# Patient Record
Sex: Male | Born: 1965 | Race: White | Hispanic: No | Smoking: Current some day smoker
Health system: Southern US, Community
[De-identification: ages and names within clinical notes are randomized; demographics above are authoritative.]

## PROBLEM LIST (undated history)

## (undated) DIAGNOSIS — I639 Cerebral infarction, unspecified: Secondary | ICD-10-CM

## (undated) DIAGNOSIS — K760 Fatty (change of) liver, not elsewhere classified: Secondary | ICD-10-CM

## (undated) DIAGNOSIS — I2699 Other pulmonary embolism without acute cor pulmonale: Secondary | ICD-10-CM

## (undated) DIAGNOSIS — K5010 Crohn's disease of large intestine without complications: Secondary | ICD-10-CM

## (undated) DIAGNOSIS — I10 Essential (primary) hypertension: Secondary | ICD-10-CM

## (undated) DIAGNOSIS — D126 Benign neoplasm of colon, unspecified: Secondary | ICD-10-CM

## (undated) HISTORY — PX: REPLACEMENT TOTAL KNEE: SUR1224

## (undated) HISTORY — DX: Crohn's disease of large intestine without complications: K50.10

## (undated) HISTORY — DX: Benign neoplasm of colon, unspecified: D12.6

## (undated) HISTORY — DX: Fatty (change of) liver, not elsewhere classified: K76.0

---

## 2014-02-26 ENCOUNTER — Emergency Department (HOSPITAL_COMMUNITY)
Admission: EM | Admit: 2014-02-26 | Discharge: 2014-02-26 | Disposition: A | Discharge status: Home / Self Care | Payer: Self-pay | Attending: Emergency Medicine | Admitting: Emergency Medicine

## 2014-02-26 ENCOUNTER — Emergency Department (HOSPITAL_COMMUNITY): Payer: Self-pay

## 2014-02-26 ENCOUNTER — Encounter (HOSPITAL_COMMUNITY): Payer: Self-pay | Admitting: Family Medicine

## 2014-02-26 DIAGNOSIS — M1712 Unilateral primary osteoarthritis, left knee: Secondary | ICD-10-CM | POA: Insufficient documentation

## 2014-02-26 DIAGNOSIS — Z96652 Presence of left artificial knee joint: Secondary | ICD-10-CM | POA: Insufficient documentation

## 2014-02-26 DIAGNOSIS — Z720 Tobacco use: Secondary | ICD-10-CM | POA: Insufficient documentation

## 2014-02-26 DIAGNOSIS — M25461 Effusion, right knee: Secondary | ICD-10-CM | POA: Insufficient documentation

## 2014-02-26 MED ORDER — OXYCODONE-ACETAMINOPHEN 5-325 MG PO TABS
1.0000 | ORAL_TABLET | Freq: Once | ORAL | Status: AC
  Administered 2014-02-26: 1 via ORAL
  Filled 2014-02-26: qty 1

## 2014-02-26 MED ORDER — HYDROMORPHONE HCL 1 MG/ML IJ SOLN
2.0000 mg | Freq: Once | INTRAMUSCULAR | Status: AC
  Administered 2014-02-26: 2 mg via INTRAMUSCULAR
  Filled 2014-02-26: qty 2

## 2014-02-26 MED ORDER — NAPROXEN 500 MG PO TABS: 500.0000 mg | ORAL_TABLET | Freq: Two times a day (BID) | ORAL | Status: DC

## 2014-02-26 MED ORDER — OXYCODONE-ACETAMINOPHEN 5-325 MG PO TABS: 1.0000 | ORAL_TABLET | ORAL | Status: DC | PRN

## 2014-02-26 NOTE — ED Notes (Signed)
Family at bedside. 

## 2014-02-26 NOTE — ED Notes (Signed)
Patient is alert and orientedx4.  Patient was explained discharge instructions and they understood them with no questions.  The patient's sister in law is taking the patient home.

## 2014-02-26 NOTE — ED Provider Notes (Signed)
CSN: [id number]     Arrival date & time 02/26/14  1129 History   First MD Initiated Contact with Patient 02/26/14 1424     Chief Complaint  Patient presents with  . Knee Pain   (Consider location/radiation/quality/duration/timing/severity/associated sxs/prior Treatment) HPI Richard Soto is a 48 yo male presenting with report of right knee pain x 1 day.  He reports waking up with the pain yesterday.  He also noted mild swelling.  The pain and swelling has become gradually worse.  He reports today he is having trouble bearing weight on it and difficulty bending his knee. He describes pain as aching and rates as 10/10.  He denies any overuse or injury.  He also denies fevers, chills, redness or heat from the knee.     History reviewed. No pertinent past medical history. Past Surgical History  Procedure Laterality Date  . Replacement total knee     History reviewed. No pertinent family history. History  Substance Use Topics  . Smoking status: Light Tobacco Smoker  . Smokeless tobacco: Not on file  . Alcohol Use: No    Review of Systems  Constitutional: Negative for fever and chills.  Respiratory: Negative for shortness of breath.   Cardiovascular: Negative for chest pain.  Gastrointestinal: Negative for nausea and vomiting.  Genitourinary: Negative for dysuria.  Musculoskeletal: Positive for myalgias, joint swelling and arthralgias.  Skin: Negative for color change and rash.  Neurological: Negative for weakness, numbness and headaches.    Allergies  Review of patient's allergies indicates no known allergies.  Home Medications   Prior to Admission medications   Not on File   BP 158/96 mmHg  Pulse 86  Temp(Src) 98 F (36.7 C) (Oral)  Resp 24  Ht 5\' 10"  (1.778 m)  Wt 200 lb (90.719 kg)  BMI 28.70 kg/m2  SpO2 98% Physical Exam  Constitutional: He appears well-developed and well-nourished. No distress.  HENT:  Head: Normocephalic and atraumatic.  Eyes: Conjunctivae are  normal.  Neck: Neck supple. No thyromegaly present.  Cardiovascular: Normal rate, regular rhythm and intact distal pulses.   Pulmonary/Chest: Effort normal and breath sounds normal. No respiratory distress.  Abdominal: Soft. There is no tenderness.  Musculoskeletal: He exhibits no tenderness.       Right knee: He exhibits decreased range of motion, swelling and effusion. He exhibits no deformity, no erythema and normal alignment.       Legs: Lymphadenopathy:    He has no cervical adenopathy.  Neurological: He is alert.  Skin: Skin is warm and dry. No rash noted. He is not diaphoretic.  Psychiatric: He has a normal mood and affect.  Nursing note and vitals reviewed.   ED Course  Procedures (including critical care time) Labs Review Labs Reviewed - No data to display  Imaging Review Dg Knee Complete 4 Views Right  02/26/2014   CLINICAL DATA:  Knee pain  EXAM: RIGHT KNEE - COMPLETE 4+ VIEW  COMPARISON:  None.  FINDINGS: Prior ACL repair. Advanced tricompartmental osteoarthritis. There is marked joint space narrowing with subchondral bone irregularity and spur formation most severe in the medial compartment. Large calcified loose body in the joint posterior posteriorly. There is a moderately large joint effusion.  Negative for acute fracture.  IMPRESSION: Postop ACL repair. Advanced tricompartmental osteoarthritis with calcified loose bodies.   Electronically Signed   By: Franchot Gallo M.D.   On: 02/26/2014 13:48     EKG Interpretation None      MDM   Final diagnoses:  Knee effusion, right   48 yo with joint effusion but no objective signs of infection. Patient X-Ray negative for obvious fracture or dislocation but shoes arthritis and effusion.  Pain managed in ED. Referral given to follow up with orthopedics if symptoms persist. Patient given knee sleeve while in ED, conservative therapy and NSAIDS recommended and discussed. Patient will be dc home & is agreeable with above  plan.   Filed Vitals:   02/26/14 1430 02/26/14 1446 02/26/14 1500 02/26/14 1515  BP: 158/96 131/88 153/83 159/86  Pulse: 86 89 84 90  Temp:      TempSrc:      Resp:      Height:      Weight:      SpO2: 98% 99% 98% 96%   Meds given in ED:  Medications  oxyCODONE-acetaminophen (PERCOCET/ROXICET) 5-325 MG per tablet 1 tablet (1 tablet Oral Given 02/26/14 1220)  oxyCODONE-acetaminophen (PERCOCET/ROXICET) 5-325 MG per tablet 1 tablet (1 tablet Oral Given 02/26/14 1354)  oxyCODONE-acetaminophen (PERCOCET/ROXICET) 5-325 MG per tablet 1 tablet (1 tablet Oral Given 02/26/14 1442)  HYDROmorphone (DILAUDID) injection 2 mg (2 mg Intramuscular Given 02/26/14 1457)    Discharge Medication List as of 02/26/2014  3:15 PM    START taking these medications   Details  naproxen (NAPROSYN) 500 MG tablet Take 1 tablet (500 mg total) by mouth 2 (two) times daily with a meal., Starting 02/26/2014, Until Discontinued, Print    oxyCODONE-acetaminophen (PERCOCET/ROXICET) 5-325 MG per tablet Take 1-2 tablets by mouth every 4 (four) hours as needed for moderate pain or severe pain., Starting 02/26/2014, Until Discontinued, Print          Britt Bottom, NP 02/26/14 2136  Mariea Clonts, MD 02/28/14 [phone]

## 2014-02-26 NOTE — ED Notes (Signed)
MD at bedside. 

## 2014-02-26 NOTE — Discharge Instructions (Signed)
Please follow the directions provided.  Use the compression sleeve, ice and elevation as needed to help with swelling.  Use the naproxen twice a day for inflammation and the pain meds as needed.  Please follow-up with the orthopedic doctor as needed to further manage this joint effusion.  It may need to be drained to help with pain and swelling.  Don't hesitate to return for any new, worsening or concerning symptoms.     SEEK IMMEDIATE MEDICAL CARE IF:  You develop a rash.  You have difficulty breathing.  You have any allergic reactions from medications you may have been given.  There is severe pain with any motion of the knee.

## 2014-02-26 NOTE — ED Notes (Signed)
Pt here for right knee pain. sts severe pain sts and sig swelling that started yesterday. sts also hurts behind knee.

## 2014-03-14 HISTORY — PX: CHOLECYSTECTOMY: SHX55

## 2015-02-24 ENCOUNTER — Inpatient Hospital Stay (HOSPITAL_COMMUNITY): Payer: Medicaid Other | Admitting: Certified Registered"

## 2015-02-24 ENCOUNTER — Encounter (HOSPITAL_COMMUNITY): Payer: Self-pay

## 2015-02-24 ENCOUNTER — Encounter (HOSPITAL_COMMUNITY)
Admission: EM | Disposition: A | Discharge status: Home / Self Care | Payer: Self-pay | Source: Home / Self Care | Attending: Neurology

## 2015-02-24 ENCOUNTER — Emergency Department (HOSPITAL_COMMUNITY): Payer: Medicaid Other

## 2015-02-24 ENCOUNTER — Inpatient Hospital Stay (HOSPITAL_COMMUNITY)
Admission: EM | Admit: 2015-02-24 | Discharge: 2015-03-02 | DRG: 056 | Disposition: A | Discharge status: Home / Self Care | Payer: Medicaid Other | Attending: Neurology | Admitting: Neurology

## 2015-02-24 ENCOUNTER — Inpatient Hospital Stay (HOSPITAL_COMMUNITY): Payer: Medicaid Other

## 2015-02-24 DIAGNOSIS — R339 Retention of urine, unspecified: Secondary | ICD-10-CM | POA: Diagnosis not present

## 2015-02-24 DIAGNOSIS — R079 Chest pain, unspecified: Secondary | ICD-10-CM

## 2015-02-24 DIAGNOSIS — I160 Hypertensive urgency: Secondary | ICD-10-CM | POA: Diagnosis not present

## 2015-02-24 DIAGNOSIS — Z8673 Personal history of transient ischemic attack (TIA), and cerebral infarction without residual deficits: Secondary | ICD-10-CM | POA: Diagnosis not present

## 2015-02-24 DIAGNOSIS — G9341 Metabolic encephalopathy: Secondary | ICD-10-CM | POA: Diagnosis not present

## 2015-02-24 DIAGNOSIS — R471 Dysarthria and anarthria: Secondary | ICD-10-CM | POA: Diagnosis present

## 2015-02-24 DIAGNOSIS — I6789 Other cerebrovascular disease: Secondary | ICD-10-CM | POA: Diagnosis not present

## 2015-02-24 DIAGNOSIS — R0602 Shortness of breath: Secondary | ICD-10-CM | POA: Diagnosis not present

## 2015-02-24 DIAGNOSIS — K219 Gastro-esophageal reflux disease without esophagitis: Secondary | ICD-10-CM | POA: Diagnosis present

## 2015-02-24 DIAGNOSIS — Z86711 Personal history of pulmonary embolism: Secondary | ICD-10-CM | POA: Diagnosis not present

## 2015-02-24 DIAGNOSIS — T4275XA Adverse effect of unspecified antiepileptic and sedative-hypnotic drugs, initial encounter: Secondary | ICD-10-CM | POA: Diagnosis not present

## 2015-02-24 DIAGNOSIS — R4781 Slurred speech: Secondary | ICD-10-CM | POA: Diagnosis present

## 2015-02-24 DIAGNOSIS — Z7982 Long term (current) use of aspirin: Secondary | ICD-10-CM

## 2015-02-24 DIAGNOSIS — E785 Hyperlipidemia, unspecified: Secondary | ICD-10-CM | POA: Diagnosis present

## 2015-02-24 DIAGNOSIS — I959 Hypotension, unspecified: Secondary | ICD-10-CM | POA: Diagnosis not present

## 2015-02-24 DIAGNOSIS — I10 Essential (primary) hypertension: Secondary | ICD-10-CM | POA: Diagnosis present

## 2015-02-24 DIAGNOSIS — R200 Anesthesia of skin: Secondary | ICD-10-CM | POA: Diagnosis present

## 2015-02-24 DIAGNOSIS — M179 Osteoarthritis of knee, unspecified: Secondary | ICD-10-CM | POA: Diagnosis present

## 2015-02-24 DIAGNOSIS — G8194 Hemiplegia, unspecified affecting left nondominant side: Principal | ICD-10-CM

## 2015-02-24 DIAGNOSIS — I639 Cerebral infarction, unspecified: Secondary | ICD-10-CM | POA: Diagnosis present

## 2015-02-24 DIAGNOSIS — E861 Hypovolemia: Secondary | ICD-10-CM | POA: Diagnosis present

## 2015-02-24 DIAGNOSIS — I63311 Cerebral infarction due to thrombosis of right middle cerebral artery: Secondary | ICD-10-CM | POA: Diagnosis not present

## 2015-02-24 DIAGNOSIS — F17200 Nicotine dependence, unspecified, uncomplicated: Secondary | ICD-10-CM | POA: Diagnosis present

## 2015-02-24 DIAGNOSIS — M25561 Pain in right knee: Secondary | ICD-10-CM | POA: Diagnosis present

## 2015-02-24 DIAGNOSIS — R0789 Other chest pain: Secondary | ICD-10-CM | POA: Diagnosis not present

## 2015-02-24 DIAGNOSIS — J9690 Respiratory failure, unspecified, unspecified whether with hypoxia or hypercapnia: Secondary | ICD-10-CM

## 2015-02-24 DIAGNOSIS — M19019 Primary osteoarthritis, unspecified shoulder: Secondary | ICD-10-CM | POA: Diagnosis present

## 2015-02-24 DIAGNOSIS — F419 Anxiety disorder, unspecified: Secondary | ICD-10-CM | POA: Diagnosis present

## 2015-02-24 DIAGNOSIS — I63031 Cerebral infarction due to thrombosis of right carotid artery: Secondary | ICD-10-CM | POA: Diagnosis not present

## 2015-02-24 HISTORY — DX: Other pulmonary embolism without acute cor pulmonale: I26.99

## 2015-02-24 HISTORY — PX: RADIOLOGY WITH ANESTHESIA: SHX6223

## 2015-02-24 LAB — COMPREHENSIVE METABOLIC PANEL
ALBUMIN: 4.5 g/dL (ref 3.5–5.0)
ALT: 42 U/L (ref 17–63)
AST: 29 U/L (ref 15–41)
Alkaline Phosphatase: 123 U/L (ref 38–126)
Anion gap: 14 (ref 5–15)
BUN: <5 mg/dL — ABNORMAL LOW (ref 6–20)
CHLORIDE: 108 mmol/L (ref 101–111)
CO2: 19 mmol/L — ABNORMAL LOW (ref 22–32)
Calcium: 9.9 mg/dL (ref 8.9–10.3)
Creatinine, Ser: 0.75 mg/dL (ref 0.61–1.24)
GFR calc Af Amer: >60 mL/min (ref >60)
GFR calc non Af Amer: >60 mL/min (ref >60)
GLUCOSE: 98 mg/dL (ref 65–99)
POTASSIUM: 4 mmol/L (ref 3.5–5.1)
Sodium: 141 mmol/L (ref 135–145)
Total Bilirubin: 0.3 mg/dL (ref 0.3–1.2)
Total Protein: 8.1 g/dL (ref 6.5–8.1)

## 2015-02-24 LAB — DIFFERENTIAL
BASOS ABS: 0 10*3/uL (ref 0.0–0.1)
BASOS PCT: 0 %
EOS ABS: 0.2 10*3/uL (ref 0.0–0.7)
Eosinophils Relative: 2 %
LYMPHS ABS: 3.7 10*3/uL (ref 0.7–4.0)
Lymphocytes Relative: 37 %
Monocytes Absolute: 0.6 10*3/uL (ref 0.1–1.0)
Monocytes Relative: 6 %
Neutro Abs: 5.5 10*3/uL (ref 1.7–7.7)
Neutrophils Relative %: 55 %

## 2015-02-24 LAB — I-STAT CHEM 8, ED
BUN: 5 mg/dL — ABNORMAL LOW (ref 6–20)
CHLORIDE: 108 mmol/L (ref 101–111)
CREATININE: 0.9 mg/dL (ref 0.61–1.24)
Calcium, Ion: 1.08 mmol/L — ABNORMAL LOW (ref 1.12–1.23)
Glucose, Bld: 95 mg/dL (ref 65–99)
HEMATOCRIT: 51 % (ref 39.0–52.0)
HEMOGLOBIN: 17.3 g/dL — AB (ref 13.0–17.0)
POTASSIUM: 4 mmol/L (ref 3.5–5.1)
Sodium: 144 mmol/L (ref 135–145)
TCO2: 19 mmol/L (ref 0–100)

## 2015-02-24 LAB — CBC
HCT: 50.2 % (ref 39.0–52.0)
Hemoglobin: 17.2 g/dL — ABNORMAL HIGH (ref 13.0–17.0)
MCH: 32.8 pg (ref 26.0–34.0)
MCHC: 34.3 g/dL (ref 30.0–36.0)
MCV: 95.8 fL (ref 78.0–100.0)
Platelets: 281 10*3/uL (ref 150–400)
RBC: 5.24 MIL/uL (ref 4.22–5.81)
RDW: 12.4 % (ref 11.5–15.5)
WBC: 10.1 10*3/uL (ref 4.0–10.5)

## 2015-02-24 LAB — PROTIME-INR
INR: 1.05 (ref 0.00–1.49)
PROTHROMBIN TIME: 13.9 s (ref 11.6–15.2)

## 2015-02-24 LAB — I-STAT TROPONIN, ED: Troponin i, poc: 0.01 ng/mL (ref 0.00–0.08)

## 2015-02-24 LAB — CBG MONITORING, ED: Glucose-Capillary: 108 mg/dL — ABNORMAL HIGH (ref 65–99)

## 2015-02-24 LAB — APTT: APTT: 35 s (ref 24–37)

## 2015-02-24 SURGERY — RADIOLOGY WITH ANESTHESIA
Anesthesia: General

## 2015-02-24 MED ORDER — PHENYLEPHRINE HCL 10 MG/ML IJ SOLN
INTRAMUSCULAR | Status: DC | PRN
  Administered 2015-02-24 (×2): 80 ug via INTRAVENOUS

## 2015-02-24 MED ORDER — LIDOCAINE HCL (CARDIAC) 20 MG/ML IV SOLN
INTRAVENOUS | Status: DC | PRN
  Administered 2015-02-24: 60 mg via INTRAVENOUS

## 2015-02-24 MED ORDER — PHENYLEPHRINE HCL 10 MG/ML IJ SOLN
10.0000 mg | INTRAVENOUS | Status: DC | PRN
  Administered 2015-02-24: 40 ug/min via INTRAVENOUS

## 2015-02-24 MED ORDER — SUCCINYLCHOLINE CHLORIDE 20 MG/ML IJ SOLN
INTRAMUSCULAR | Status: DC | PRN
  Administered 2015-02-24: 120 mg via INTRAVENOUS

## 2015-02-24 MED ORDER — ALTEPLASE (STROKE) FULL DOSE INFUSION
0.9000 mg/kg | Freq: Once | INTRAVENOUS | Status: DC
  Filled 2015-02-24: qty 84

## 2015-02-24 MED ORDER — LIDOCAINE HCL 1 % IJ SOLN
INTRAMUSCULAR | Status: AC
  Filled 2015-02-24: qty 20

## 2015-02-24 MED ORDER — LABETALOL HCL 5 MG/ML IV SOLN
10.0000 mg | Freq: Once | INTRAVENOUS | Status: AC
  Administered 2015-02-24: 10 mg via INTRAVENOUS

## 2015-02-24 MED ORDER — NITROGLYCERIN 1 MG/10 ML FOR IR/CATH LAB
INTRA_ARTERIAL | Status: AC
  Filled 2015-02-24: qty 10

## 2015-02-24 MED ORDER — LABETALOL HCL 5 MG/ML IV SOLN
INTRAVENOUS | Status: AC
  Filled 2015-02-24: qty 4

## 2015-02-24 MED ORDER — SODIUM CHLORIDE 0.9 % IV SOLN
INTRAVENOUS | Status: DC | PRN
  Administered 2015-02-24: via INTRAVENOUS

## 2015-02-24 MED ORDER — PROPOFOL 10 MG/ML IV BOLUS
INTRAVENOUS | Status: DC | PRN
  Administered 2015-02-24: 200 mg via INTRAVENOUS

## 2015-02-24 MED ORDER — FENTANYL CITRATE (PF) 100 MCG/2ML IJ SOLN
INTRAMUSCULAR | Status: DC | PRN
  Administered 2015-02-24: 100 ug via INTRAVENOUS

## 2015-02-24 MED ORDER — ROCURONIUM BROMIDE 100 MG/10ML IV SOLN
INTRAVENOUS | Status: DC | PRN
  Administered 2015-02-24 – 2015-02-25 (×2): 25 mg via INTRAVENOUS

## 2015-02-24 NOTE — ED Provider Notes (Signed)
CSN: [id number]     Arrival date & time 02/24/15  2159 History   First MD Initiated Contact with Patient 02/24/15 2242     Chief Complaint  Patient presents with  . Numbness    An emergency department physician performed an initial assessment on this suspected stroke patient at 2228. (Consider location/radiation/quality/duration/timing/severity/associated sxs/prior Treatment) HPI Patient presents with concern of new left upper extremity, lower extremity numbness, difficulty speaking. On exam the patient has notable speech difficulty, is anxious, cannot recall, provide details of the history of present illness. Level V caveat secondary to acuity of condition, per report, the patient's brother, the patient had notable change approximately 5 hours prior to our initial evaluation. Patient suddenly developed foaming at the mouth, drooling, complained of left arm, left leg numbness.   The patient was in his usual state of health. Patient has a history of prior stroke. Patient was designated as a code stroke. Patient's initial evaluation conducted with our neurology colleagues per  Past Medical History  Diagnosis Date  . Pulmonary embolism [facility])    Past Surgical History  Procedure Laterality Date  . Replacement total knee     History reviewed. No pertinent family history. Social History  Substance Use Topics  . Smoking status: Light Tobacco Smoker  . Smokeless tobacco: None  . Alcohol Use: No    Review of Systems  Unable to perform ROS: Acuity of condition      Allergies  Review of patient's allergies indicates no known allergies.  Home Medications   Prior to Admission medications   Medication Sig Start Date End Date Taking? Authorizing Provider  amLODipine (NORVASC) 10 MG tablet Take 10 mg by mouth daily. 12/14/14 12/14/15 Yes Historical Provider, MD  aspirin EC 81 MG tablet Take 81 mg by mouth daily.   Yes Historical Provider, MD  Omega-3 Fatty Acids (FISH OIL) 1000 MG  CAPS Take 1 capsule by mouth daily.   Yes Historical Provider, MD  traZODone (DESYREL) 100 MG tablet Take 50 mg by mouth at bedtime. 12/17/14  Yes Historical Provider, MD   BP 175/117 mmHg  Pulse 92  Temp(Src) 98.4 F (36.9 C) (Oral)  Resp 18  Wt 205 lb 11 oz (93.3 kg)  SpO2 100% Physical Exam  Constitutional:  Very uncomfortable, anxious appearing male, delayed speech, disoriented  HENT:  Head: Normocephalic and atraumatic.  Very poor dentition  Eyes: Conjunctivae are normal. Right eye exhibits no discharge. Left eye exhibits no discharge.  Cardiovascular: Normal rate and intact distal pulses.   Pulmonary/Chest: Effort normal. No stridor. No respiratory distress.  Abdominal: He exhibits no distension. There is no tenderness.  Musculoskeletal:  No obvious deformities  Neurological: He is alert. He displays no atrophy and no tremor. A cranial nerve deficit is present. He exhibits abnormal muscle tone. He displays no seizure activity. Coordination abnormal.  Patient has delayed, brief, inconsistent speech, his repetitive, disoriented. Patient has diminished tone in the left upper, left lower extremities.     ED Course  Procedures (including critical care time) Labs Review Labs Reviewed  CBC - Abnormal; Notable for the following:    Hemoglobin 17.2 (*)    All other components within normal limits  COMPREHENSIVE METABOLIC PANEL - Abnormal; Notable for the following:    CO2 19 (*)    BUN <5 (*)    All other components within normal limits  CBG MONITORING, ED - Abnormal; Notable for the following:    Glucose-Capillary 108 (*)    All other components  within normal limits  I-STAT CHEM 8, ED - Abnormal; Notable for the following:    BUN 5 (*)    Calcium, Ion 1.08 (*)    Hemoglobin 17.3 (*)    All other components within normal limits  PROTIME-INR  APTT  DIFFERENTIAL  ETHANOL  I-STAT TROPOININ, ED    Imaging Review Ct Head Wo Contrast  02/24/2015  CLINICAL DATA:  LEFT  arm and leg weakness, nonverbal; last seen normal at 6 p.m. History of pulmonary embolism. Assess stroke. EXAM: CT HEAD WITHOUT CONTRAST TECHNIQUE: Contiguous axial images were obtained from the base of the skull through the vertex without intravenous contrast. COMPARISON:  None. FINDINGS: Subtle apparent RIGHT insular ribbon sign with RIGHT insular dot sign. Ventricles and sulci are overall normal for age. No midline shift, mass effect. No abnormal extra-axial fluid collections. Basal cisterns are patent. 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 Moderate calcific atherosclerosis of the carotid siphons. Paranasal sinuses and mastoid air cells are well aerated. Ocular globes and orbital contents are unremarkable. 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 No hemorrhagic conversion. Critical Value/emergent results were called by telephone at the time of interpretation on 02/24/2015 at 10:36 pm to Dr. Nicole Kindred, NEUROLOGY, who verbally acknowledged these results. Electronically Signed   By: Elon Alas M.D.   On: 02/24/2015 22:38   I have personally reviewed and evaluated these images and lab results as part of my medical decision-making.  With CT evidence for thromboembolic MCA process, the patient was prepared for TPA. However, the patient is out of the therapeutic window.  After discussion with our neurology cause, the patient will instead go to interventional radiology.   On repeat exam the patient exhibited similar condition, persistent speech deficit, left-sided weakness. Patient's blood pressure has changed minimally in spite of 210 mg boluses of labetalol.     EKG Interpretation   Date/Time:  Tuesday February 24 2015 22:06:47 EST Ventricular Rate:  89 PR Interval:  120 QRS Duration: 82 QT Interval:  342 QTC Calculation: 416 R Axis:   40 Text Interpretation:  Normal sinus rhythm Normal ECG Sinus rhythm  Premature atrial  complexes Borderline ECG Confirmed by Carmin Muskrat   MD [phone]) on 02/24/2015 11:36:52 PM      on repeat exam the patient is a similar condition, awaiting IR availability.  MDM   Final diagnoses:  Numbness   stroke  Patient presents with sudden onset left upper and lower extremity numbness, weakness, as well as new speech deficit or Patient on exam is anxious, disoriented, has clinical findings consistent with stroke, this is also demonstrated on CT scan. Given the patient's total deficits, the patient had blood pressure control, was prepared for interventional radiology intervention. Patient was not a candidate for TPA given the passage of time since the onset of symptoms. Patient's case conducted with our neurology team.   CRITICAL CARE Performed by: Carmin Muskrat Total critical care time: 40 minutes Critical care time was exclusive of separately billable procedures and treating other patients. Critical care was necessary to treat or prevent imminent or life-threatening deterioration. Critical care was time spent personally by me on the following activities: development of treatment plan with patient and/or surrogate as well as nursing, discussions with consultants, evaluation of patient's response to treatment, examination of patient, obtaining history from patient or surrogate, ordering and performing treatments and interventions, ordering and review of laboratory studies, ordering and review of radiographic studies, pulse oximetry and re-evaluation of  patient's condition.    Carmin Muskrat, MD 02/24/15 [phone]

## 2015-02-24 NOTE — ED Notes (Signed)
Dr. Nicole Kindred, neurology, paged to report bp.

## 2015-02-24 NOTE — ED Notes (Signed)
Spoke to Dr. Nicole Kindred, neurology, to report bp 175/117, p of 93. MD gives verbal order to repeat 10mg  of labetalol. Also inquired about eta for IR. He advises team is currently setting up.

## 2015-02-24 NOTE — ED Notes (Signed)
Bedside report to IR team, Dr. Nicole Kindred, neurology present during transfer. Relayed to team bed assigned on 3100 and report already given.

## 2015-02-24 NOTE — Code Documentation (Signed)
Richard Soto is a 49yo wm presenting to the [facility] via pvt vehicle for sudden onset Lt side weakness and progressing symptoms.  LKW 1800.  Pt was with his brother when he developed CP and HA and then Lt side weakness.  His brother reports his symptoms worsened and he brought him to the ED.  The pt admits to having 2 mixed drinks this evening.  NIH 15 for Lt side deficits, visual, and sensory deficits.  He is outside the window for tPA but plan to take him to IR for further treatment.

## 2015-02-24 NOTE — ED Notes (Signed)
Verbal order for 10mg  of labetalol per Dr. Wallie Char.

## 2015-02-24 NOTE — ED Notes (Addendum)
Pts brother brought pt in tonight because pt has been foaming from the mouth/drooling and was stating left arm and leg numbness onset tonight at 6pm. MD Pfeiffer at triage to assess pt. Code stroke to be called.

## 2015-02-25 ENCOUNTER — Inpatient Hospital Stay (HOSPITAL_COMMUNITY): Payer: Medicaid Other

## 2015-02-25 ENCOUNTER — Encounter (HOSPITAL_COMMUNITY): Payer: Self-pay

## 2015-02-25 DIAGNOSIS — I160 Hypertensive urgency: Secondary | ICD-10-CM

## 2015-02-25 DIAGNOSIS — I63311 Cerebral infarction due to thrombosis of right middle cerebral artery: Secondary | ICD-10-CM

## 2015-02-25 DIAGNOSIS — I6789 Other cerebrovascular disease: Secondary | ICD-10-CM

## 2015-02-25 DIAGNOSIS — I639 Cerebral infarction, unspecified: Secondary | ICD-10-CM | POA: Diagnosis present

## 2015-02-25 DIAGNOSIS — R0602 Shortness of breath: Secondary | ICD-10-CM

## 2015-02-25 DIAGNOSIS — R200 Anesthesia of skin: Secondary | ICD-10-CM

## 2015-02-25 DIAGNOSIS — G8194 Hemiplegia, unspecified affecting left nondominant side: Secondary | ICD-10-CM

## 2015-02-25 DIAGNOSIS — R0789 Other chest pain: Secondary | ICD-10-CM | POA: Diagnosis not present

## 2015-02-25 DIAGNOSIS — I63031 Cerebral infarction due to thrombosis of right carotid artery: Secondary | ICD-10-CM

## 2015-02-25 LAB — CK TOTAL AND CKMB (NOT AT ARMC)
CK TOTAL: 48 U/L — AB (ref 49–397)
CK, MB: 2.3 ng/mL (ref 0.5–5.0)
CK, MB: 1.2 ng/mL (ref 0.5–5.0)
CK, MB: 1.5 ng/mL (ref 0.5–5.0)
RELATIVE INDEX: INVALID (ref 0.0–2.5)
RELATIVE INDEX: INVALID (ref 0.0–2.5)
Relative Index: INVALID (ref 0.0–2.5)
Total CK: 60 U/L (ref 49–397)
Total CK: 41 U/L — ABNORMAL LOW (ref 49–397)

## 2015-02-25 LAB — CBC
HEMATOCRIT: 41.9 % (ref 39.0–52.0)
HEMOGLOBIN: 13.9 g/dL (ref 13.0–17.0)
MCH: 32.5 pg (ref 26.0–34.0)
MCHC: 33.2 g/dL (ref 30.0–36.0)
MCV: 97.9 fL (ref 78.0–100.0)
Platelets: 221 10*3/uL (ref 150–400)
RBC: 4.28 MIL/uL (ref 4.22–5.81)
RDW: 12.6 % (ref 11.5–15.5)
WBC: 11 10*3/uL — AB (ref 4.0–10.5)

## 2015-02-25 LAB — POCT I-STAT 3, ART BLOOD GAS (G3+)
ACID-BASE DEFICIT: 8 mmol/L — AB (ref 0.0–2.0)
Bicarbonate: 18.8 mEq/L — ABNORMAL LOW (ref 20.0–24.0)
O2 Saturation: 100 %
PCO2 ART: 41.9 mmHg (ref 35.0–45.0)
PO2 ART: 399 mmHg — AB (ref 80.0–100.0)
Patient temperature: 97.4
TCO2: 20 mmol/L (ref 0–100)
pH, Arterial: 7.257 — ABNORMAL LOW (ref 7.350–7.450)

## 2015-02-25 LAB — LIPID PANEL
CHOL/HDL RATIO: 3.6 ratio
Cholesterol: 133 mg/dL (ref 0–200)
HDL: 37 mg/dL — ABNORMAL LOW (ref >40)
LDL CALC: 83 mg/dL (ref 0–99)
Triglycerides: 67 mg/dL (ref <150)
VLDL: 13 mg/dL (ref 0–40)

## 2015-02-25 LAB — MRSA PCR SCREENING: MRSA BY PCR: NEGATIVE

## 2015-02-25 LAB — BASIC METABOLIC PANEL
ANION GAP: 13 (ref 5–15)
BUN: 6 mg/dL (ref 6–20)
CHLORIDE: 111 mmol/L (ref 101–111)
CO2: 17 mmol/L — AB (ref 22–32)
Calcium: 8.6 mg/dL — ABNORMAL LOW (ref 8.9–10.3)
Creatinine, Ser: 0.87 mg/dL (ref 0.61–1.24)
GFR calc non Af Amer: >60 mL/min (ref >60)
GLUCOSE: 101 mg/dL — AB (ref 65–99)
POTASSIUM: 4.3 mmol/L (ref 3.5–5.1)
Sodium: 141 mmol/L (ref 135–145)

## 2015-02-25 LAB — RAPID URINE DRUG SCREEN, HOSP PERFORMED
AMPHETAMINES: NOT DETECTED
Barbiturates: NOT DETECTED
Benzodiazepines: POSITIVE — AB
COCAINE: NOT DETECTED
OPIATES: POSITIVE — AB
TETRAHYDROCANNABINOL: NOT DETECTED

## 2015-02-25 LAB — ETHANOL: Alcohol, Ethyl (B): 273 mg/dL — ABNORMAL HIGH (ref <5)

## 2015-02-25 LAB — PHOSPHORUS: Phosphorus: 4.2 mg/dL (ref 2.5–4.6)

## 2015-02-25 LAB — TROPONIN I
Troponin I: <0.03 ng/mL (ref <0.031)
Troponin I: <0.03 ng/mL (ref <0.031)

## 2015-02-25 LAB — TRIGLYCERIDES: TRIGLYCERIDES: 162 mg/dL — AB (ref <150)

## 2015-02-25 LAB — MAGNESIUM: MAGNESIUM: 1.8 mg/dL (ref 1.7–2.4)

## 2015-02-25 MED ORDER — IOHEXOL 350 MG/ML SOLN
60.0000 mL | Freq: Once | INTRAVENOUS | Status: AC | PRN
  Administered 2015-02-25: 60 mL via INTRAVENOUS

## 2015-02-25 MED ORDER — MORPHINE SULFATE (PF) 2 MG/ML IV SOLN
INTRAVENOUS | Status: AC
  Filled 2015-02-25: qty 1

## 2015-02-25 MED ORDER — STROKE: EARLY STAGES OF RECOVERY BOOK
Freq: Once | Status: AC
  Administered 2015-02-25: 03:00:00
  Filled 2015-02-25: qty 1

## 2015-02-25 MED ORDER — SODIUM CHLORIDE 0.9 % IV SOLN
1.0000 g | Freq: Once | INTRAVENOUS | Status: AC
  Administered 2015-02-25: 1 g via INTRAVENOUS
  Filled 2015-02-25: qty 10

## 2015-02-25 MED ORDER — WHITE PETROLATUM GEL
Status: AC
  Administered 2015-02-25: 08:00:00
  Filled 2015-02-25: qty 1

## 2015-02-25 MED ORDER — THIAMINE HCL 100 MG/ML IJ SOLN
100.0000 mg | Freq: Every day | INTRAMUSCULAR | Status: DC
  Administered 2015-02-25 – 2015-02-26 (×2): 100 mg via INTRAVENOUS
  Filled 2015-02-25 (×2): qty 2

## 2015-02-25 MED ORDER — FOLIC ACID 5 MG/ML IJ SOLN
1.0000 mg | Freq: Every day | INTRAMUSCULAR | Status: DC
  Administered 2015-02-25: 1 mg via INTRAVENOUS
  Filled 2015-02-25 (×2): qty 0.2

## 2015-02-25 MED ORDER — LORAZEPAM 2 MG/ML IJ SOLN
2.0000 mg | Freq: Once | INTRAMUSCULAR | Status: AC
  Administered 2015-02-25: 2 mg via INTRAVENOUS

## 2015-02-25 MED ORDER — FENTANYL CITRATE (PF) 100 MCG/2ML IJ SOLN: 100.0000 ug | INTRAMUSCULAR | Status: DC | PRN

## 2015-02-25 MED ORDER — ACETAMINOPHEN 325 MG PO TABS
650.0000 mg | ORAL_TABLET | ORAL | Status: DC | PRN
Start: 2015-02-25 — End: 2015-03-02
  Administered 2015-02-28 (×2): 650 mg via ORAL
  Filled 2015-02-25 (×2): qty 2

## 2015-02-25 MED ORDER — ADULT MULTIVITAMIN W/MINERALS CH
1.0000 | ORAL_TABLET | Freq: Every day | ORAL | Status: DC
  Administered 2015-02-25 – 2015-03-02 (×6): 1 via ORAL
  Filled 2015-02-25 (×7): qty 1

## 2015-02-25 MED ORDER — SODIUM CHLORIDE 0.9 % IV SOLN
INTRAVENOUS | Status: DC
  Administered 2015-02-25 (×2): via INTRAVENOUS
  Administered 2015-02-26: 75 mL via INTRAVENOUS
  Administered 2015-02-26: 03:00:00 via INTRAVENOUS

## 2015-02-25 MED ORDER — ENOXAPARIN SODIUM 40 MG/0.4ML ~~LOC~~ SOLN
40.0000 mg | SUBCUTANEOUS | Status: DC
  Administered 2015-02-25 – 2015-03-02 (×6): 40 mg via SUBCUTANEOUS
  Filled 2015-02-25 (×6): qty 0.4

## 2015-02-25 MED ORDER — SODIUM CHLORIDE 0.9 % IV BOLUS (SEPSIS)
1000.0000 mL | Freq: Once | INTRAVENOUS | Status: AC
  Administered 2015-02-25: 1000 mL via INTRAVENOUS

## 2015-02-25 MED ORDER — SODIUM CHLORIDE 0.9 % IV SOLN
INTRAVENOUS | Status: DC
  Administered 2015-02-25: 04:00:00 via INTRAVENOUS

## 2015-02-25 MED ORDER — MORPHINE SULFATE (PF) 2 MG/ML IV SOLN
2.0000 mg | Freq: Once | INTRAVENOUS | Status: AC
  Administered 2015-02-25: 2 mg via INTRAVENOUS

## 2015-02-25 MED ORDER — NITROGLYCERIN 0.4 MG SL SUBL
0.4000 mg | SUBLINGUAL_TABLET | SUBLINGUAL | Status: AC | PRN
  Administered 2015-02-25 – 2015-03-01 (×3): 0.4 mg via SUBLINGUAL
  Filled 2015-02-25: qty 1

## 2015-02-25 MED ORDER — CEFAZOLIN SODIUM-DEXTROSE 2-3 GM-% IV SOLR
INTRAVENOUS | Status: AC
  Administered 2015-02-25: 2 g via INTRAVENOUS
  Filled 2015-02-25: qty 50

## 2015-02-25 MED ORDER — IOHEXOL 300 MG/ML  SOLN
150.0000 mL | Freq: Once | INTRAMUSCULAR | Status: AC | PRN
  Administered 2015-02-25: 50 mL via INTRAVENOUS

## 2015-02-25 MED ORDER — TRAMADOL HCL 50 MG PO TABS
100.0000 mg | ORAL_TABLET | Freq: Four times a day (QID) | ORAL | Status: DC | PRN
  Administered 2015-02-25 – 2015-02-27 (×3): 100 mg via ORAL
  Filled 2015-02-25 (×3): qty 2

## 2015-02-25 MED ORDER — AMLODIPINE BESYLATE 10 MG PO TABS
10.0000 mg | ORAL_TABLET | Freq: Every day | ORAL | Status: DC
Start: 2015-02-25 — End: 2015-03-02
  Administered 2015-02-25 – 2015-03-02 (×6): 10 mg via ORAL
  Filled 2015-02-25 (×6): qty 1

## 2015-02-25 MED ORDER — PROPOFOL 1000 MG/100ML IV EMUL
0.0000 ug/kg/min | INTRAVENOUS | Status: DC
  Administered 2015-02-25: 20 ug/kg/min via INTRAVENOUS

## 2015-02-25 MED ORDER — FENTANYL CITRATE (PF) 100 MCG/2ML IJ SOLN
100.0000 ug | INTRAMUSCULAR | Status: DC | PRN
  Administered 2015-02-25: 100 ug via INTRAVENOUS

## 2015-02-25 MED ORDER — ACETAMINOPHEN-CODEINE #3 300-30 MG PO TABS
1.0000 | ORAL_TABLET | ORAL | Status: DC | PRN
  Administered 2015-02-25 – 2015-03-02 (×11): 1 via ORAL
  Filled 2015-02-25 (×11): qty 1

## 2015-02-25 MED ORDER — PANTOPRAZOLE SODIUM 40 MG IV SOLR
40.0000 mg | Freq: Every day | INTRAVENOUS | Status: DC
  Filled 2015-02-25: qty 40

## 2015-02-25 MED ORDER — ASPIRIN 325 MG PO TABS
325.0000 mg | ORAL_TABLET | Freq: Every day | ORAL | Status: DC
  Administered 2015-02-25 – 2015-03-02 (×6): 325 mg via ORAL
  Filled 2015-02-25 (×6): qty 1

## 2015-02-25 MED ORDER — NITROGLYCERIN 0.4 MG SL SUBL
SUBLINGUAL_TABLET | SUBLINGUAL | Status: AC
  Filled 2015-02-25: qty 1

## 2015-02-25 MED ORDER — ASPIRIN 325 MG PO TABS
325.0000 mg | ORAL_TABLET | Freq: Once | ORAL | Status: DC
  Filled 2015-02-25: qty 1

## 2015-02-25 MED ORDER — ACETAMINOPHEN 650 MG RE SUPP: 650.0000 mg | RECTAL | Status: DC | PRN

## 2015-02-25 MED ORDER — LABETALOL HCL 5 MG/ML IV SOLN
10.0000 mg | INTRAVENOUS | Status: DC | PRN
  Administered 2015-02-25: 10 mg via INTRAVENOUS

## 2015-02-25 MED ORDER — PANTOPRAZOLE SODIUM 40 MG IV SOLR: 40.0000 mg | Freq: Every day | INTRAVENOUS | Status: DC

## 2015-02-25 NOTE — Evaluation (Signed)
Clinical/Bedside Swallow Evaluation Patient Details  Name: Richard Soto MRN: [id number] Date of Birth: 10/01/1965  Today's Date: 02/25/2015 Time: SLP Start Time (ACUTE ONLY): 1026 SLP Stop Time (ACUTE ONLY): 1047 SLP Time Calculation (min) (ACUTE ONLY): 21 min  Past Medical History:  Past Medical History  Diagnosis Date  . Pulmonary embolism [facility])    Past Surgical History:  Past Surgical History  Procedure Laterality Date  . Replacement total knee     HPI:  49 y.o. male with a history of hypertension, pulmonary embolus and hyperlipidemia, brought to the emergency room following onset of weakness and numbness involving his left side as well as slurred speech.CT scan of his head showed increased density of distal right M1 MCA. There was also early signs of possible ischemic changes in right MCA territory. S/p catheter cerebral angiogram. Brief intubation for procedure only.    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 Overall however, oropharyngeal swallowing abilities are functional for initiation of dysphagia 3 solids and thin liquids with close supervision as lethargy likely to also impact function and increase aspiration risk. Educated Therapist, sports. SLP will f/u for diet tolerance, potential to advance and cognitive-linguistic evaluation.     Aspiration Risk  Moderate aspiration risk    Diet Recommendation Dysphagia 3 (Mech soft);Thin liquid   Liquid Administration via: Cup;Straw Medication Administration: Whole meds with liquid Supervision: Patient able to self feed;Full supervision/cueing for compensatory strategies Compensations: Slow rate;Small sips/bites;Monitor for anterior loss;Lingual sweep for clearance of pocketing Postural Changes: Seated upright at 90 degrees    Other  Recommendations Oral Care Recommendations:  Oral care BID   Follow up Recommendations   (TBD)    Frequency and Duration min 1 x/week  1 week       Prognosis Prognosis for Safe Diet Advancement: Good      Swallow Study   General HPI: 49 y.o. male with a history of hypertension, pulmonary embolus and hyperlipidemia, brought to the emergency room following onset of weakness and numbness involving his left side as well as slurred speech.CT scan of his head showed increased density of distal right M1 MCA. There was also early signs of possible ischemic changes in right MCA territory. S/p catheter cerebral angiogram. Brief intubation for procedure only.  Type of Study: Bedside Swallow Evaluation Previous Swallow Assessment: none noted Diet Prior to this Study: NPO Temperature Spikes Noted: No Respiratory Status: Room air History of Recent Intubation: Yes Length of Intubations (days):  (for procedure only) Date extubated: 02/25/15 Behavior/Cognition: Lethargic/Drowsy;Cooperative Oral Cavity Assessment: Within Functional Limits Oral Care Completed by SLP: Yes Oral Cavity - Dentition: Poor condition Vision: Functional for self-feeding Self-Feeding Abilities: Able to feed self Patient Positioning: Upright in bed Baseline Vocal Quality: Hoarse (mild) Volitional Cough: Weak Volitional Swallow: Able to elicit    Oral/Motor/Sensory Function Overall Oral Motor/Sensory Function: Mild impairment Facial ROM: Reduced left;Suspected CN VII (facial) dysfunction Facial Symmetry: Abnormal symmetry left;Suspected CN VII (facial) dysfunction Facial Strength: Reduced left Facial Sensation: Reduced left;Suspected CN V (Trigeminal) dysfunction Lingual ROM: Reduced right;Reduced left Lingual Symmetry: Within Functional Limits Lingual Strength: Reduced Lingual Sensation: Within Functional Limits Velum: Within Functional Limits Mandible: Within Functional Limits   Ice Chips Ice chips: Within functional limits Presentation: Spoon   Thin Liquid  Thin Liquid: Impaired Presentation: Straw;Cup;Self Fed (with assist) Oral Phase Impairments: Reduced labial seal Oral Phase Functional Implications: Other (comment) (  trace anterior labial spillage)    Nectar Thick Nectar Thick Liquid: Not tested   Honey Thick Honey Thick Liquid: Not tested   Puree Puree: Impaired Presentation: Spoon Oral Phase Impairments: Impaired mastication Oral Phase Functional Implications: Prolonged oral transit   Solid Solid: Impaired Presentation: Spoon Oral Phase Impairments: Impaired mastication Oral Phase Functional Implications: Prolonged oral transit;Oral residue (mild delay and mild oral residue)      Richard Hannifin MA, CCC-SLP [phone]  Richard Soto Richard Soto 02/25/2015,10:52 AM

## 2015-02-25 NOTE — Anesthesia Preprocedure Evaluation (Signed)
Anesthesia Evaluation  Patient identified by MRN, date of birth, ID bandPreop documentation limited or incomplete due to emergent nature of procedure.  History of Anesthesia Complications Negative for: history of anesthetic complications  Airway Mallampati: II  TM Distance: >3 FB Neck ROM: Full    Dental  (+) Poor Dentition, Chipped, Missing, Loose   Pulmonary Current Smoker,           Cardiovascular hypertension, Pt. on medications  Rhythm:Regular Rate:Normal     Neuro/Psych CVA    GI/Hepatic   Endo/Other    Renal/GU      Musculoskeletal   Abdominal   Peds  Hematology   Anesthesia Other Findings   Reproductive/Obstetrics                             Anesthesia Physical Anesthesia Plan  ASA: IV and emergent  Anesthesia Plan: General   Post-op Pain Management:    Induction: Intravenous, Rapid sequence and Cricoid pressure planned  Airway Management Planned: Oral ETT  Additional Equipment:   Intra-op Plan:   Post-operative Plan: Possible Post-op intubation/ventilation  Informed Consent: I have reviewed the patients History and Physical, chart, labs and discussed the procedure including the risks, benefits and alternatives for the proposed anesthesia with the patient or authorized representative who has indicated his/her understanding and acceptance.   Dental advisory given  Plan Discussed with: Anesthesiologist and Surgeon  Anesthesia Plan Comments:         Anesthesia Quick Evaluation

## 2015-02-25 NOTE — Progress Notes (Signed)
[facility] Progress Note Patient Name: Julis Gannett DOB: Mar 16, 1965 MRN: [id number]   Date of Service  02/25/2015  HPI/Events of Note  Patient hypotensive, sbp int he 16s  eICU Interventions  Stop propofol NS bolus 1L     Intervention Category Major Interventions: Hypovolemia - evaluation and treatment with fluids  [place] Mer[place] 02/25/2015, 2:15 AM

## 2015-02-25 NOTE — Progress Notes (Signed)
SLP Cancellation Note  Patient Details Name: Yoshito Poznanski MRN: [id number] DOB: [birth year]   Cancelled treatment:       Reason Eval/Treat Not Completed: Medical issues which prohibited therapy  Gabriel Rainwater [place], CCC-SLP [phone]  Gabriel Rainwater Meryl 02/25/2015, 7:49 AM

## 2015-02-25 NOTE — Progress Notes (Signed)
   02/25/15 0446  Vent Select  Invasive or Noninvasive Noninvasive  Adult Vent Y  Adult Ventilator Settings  Vent Type Servo i  Vent Mode BIPAP  Set Rate 12 bmp  FiO2 (%) 50 %  Pressure Support 12 cmH20  PEEP 5 cmH20  Adult Ventilator Measurements  Peak Airway Pressure 13 L/min  Resp Rate Spontaneous 3 br/min  Resp Rate Total 15 br/min  Exhaled Vt 935 mL  Measured Ve 9.5 mL  HOB> 30 Degrees Y  Adult Ventilator Alarms  Alarms On Y  Ve High Alarm 30 L/min  Ve Low Alarm 5 L/min  Resp Rate High Alarm 35 br/min  Resp Rate Low Alarm 5  PEEP Low Alarm 2 cmH2O  Patient placed on Bipap post extubation due to low sats and apneic episodes. He tolerates it very well at this time. CT scan pending

## 2015-02-25 NOTE — Anesthesia Postprocedure Evaluation (Signed)
Anesthesia Post Note  Patient: Richard Soto  Procedure(s) Performed: Procedure(s) (LRB): RADIOLOGY WITH ANESTHESIA (N/A)  Anesthesia Post Evaluation  Last Vitals:  Filed Vitals:   02/25/15 0800 02/25/15 0900  BP: 147/81 136/74  Pulse: 102 92  Temp: 37.1 C   Resp: 22 22    Last Pain: There were no vitals filed for this visit.               [id number]

## 2015-02-25 NOTE — Consult Note (Signed)
PULMONARY / CRITICAL CARE MEDICINE   Name: Richard Soto MRN: [id number] DOB: 02-04-1966    ADMISSION DATE:  02/24/2015 CONSULTATION DATE:  02/25/15  REFERRING MD:  Estanislado Pandy  CHIEF COMPLAINT:  Vent management.  HISTORY OF PRESENT ILLNESS:  Pt is encephelopathic; therefore, this HPI is obtained from chart review. Richard Soto is a 49 y.o. male with PMH as outlined below. He was brought to [facility] ED 12/13 because he had been drooling and foaming at the mouth and had left arm and leg numbness.  Code stroke was activated on his arrival to ED.  He was last seen normal around 6PM that evening and on arrival to ED, was out of the window for tPA.   CT showed findings concerning for acute R MCA infarct.  He was therefore taken to IR for possible intervention.  Per RN report, no interventions were performed in IR as there were no abnormalities found.  Pt remained intubated and returned to the ICU on the vent.  PCCM was consulted for vent management.  PAST MEDICAL HISTORY :  He  has a past medical history of Pulmonary embolism ([place]).  PAST SURGICAL HISTORY: He  has past surgical history that includes Replacement total knee.  No Known Allergies  No current facility-administered medications on file prior to encounter.   Current Outpatient Prescriptions on File Prior to Encounter  Medication Sig  . Omega-3 Fatty Acids (FISH OIL) 1000 MG CAPS Take 1 capsule by mouth daily.    FAMILY HISTORY:  His has no family status information on file.   SOCIAL HISTORY: He  reports that he has been smoking.  He does not have any smokeless tobacco history on file. He reports that he does not drink alcohol.  REVIEW OF SYSTEMS:  Unable to obtain as pt is encephalopathic.  SUBJECTIVE:  On vent.  Not following commands.  VITAL SIGNS: BP 175/117 mmHg  Pulse 92  Temp(Src) 98.4 F (36.9 C) (Oral)  Resp 18  Wt 93.3 kg (205 lb 11 oz)  SpO2 100%  HEMODYNAMICS:    VENTILATOR SETTINGS:    INTAKE / OUTPUT:    PHYSICAL EXAMINATION: General: Adult male, in NAD. Neuro: Sedated, does not follow commands. HEENT: [place]/AT. PERRL, sclerae anicteric. Cardiovascular: RRR, no M/R/G.  Lungs: Respirations even and unlabored.  CTA bilaterally, No W/R/R. Abdomen: BS x 4, soft, NT/ND.  Musculoskeletal: No gross deformities, no edema.  Skin: Intact, warm, no rashes.  LABS:  BMET  Recent Labs Lab 02/24/15 2229 02/24/15 2233  NA 144 141  K 4.0 4.0  CL 108 108  CO2  --  19*  BUN 5* <5*  CREATININE 0.90 0.75  GLUCOSE 95 98    Electrolytes  Recent Labs Lab 02/24/15 2233  CALCIUM 9.9    CBC  Recent Labs Lab 02/24/15 2229 02/24/15 2233  WBC  --  10.1  HGB 17.3* 17.2*  HCT 51.0 50.2  PLT  --  281    Coag's  Recent Labs Lab 02/24/15 2233  APTT 35  INR 1.05    Sepsis Markers No results for input(s): LATICACIDVEN, PROCALCITON, O2SATVEN in the last 168 hours.  ABG No results for input(s): PHART, PCO2ART, PO2ART in the last 168 hours.  Liver Enzymes  Recent Labs Lab 02/24/15 2233  AST 29  ALT 42  ALKPHOS 123  BILITOT 0.3  ALBUMIN 4.5    Cardiac Enzymes No results for input(s): TROPONINI, PROBNP in the last 168 hours.  Glucose  Recent Labs Lab 02/24/15 [place]  108*    Imaging Ct Head Wo Contrast  02/24/2015  CLINICAL DATA:  LEFT arm and leg weakness, nonverbal; last seen normal at 6 p.m. History of pulmonary embolism. Assess stroke. EXAM: CT HEAD WITHOUT CONTRAST TECHNIQUE: Contiguous axial images were obtained from the base of the skull through the vertex without intravenous contrast. COMPARISON:  None. FINDINGS: Subtle apparent RIGHT insular ribbon sign with RIGHT insular dot sign. Ventricles and sulci are overall normal for age. No midline shift, mass effect. No abnormal extra-axial fluid collections. Basal cisterns are patent. 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 Moderate calcific atherosclerosis of the carotid siphons. Paranasal sinuses and mastoid  air cells are well aerated. Ocular globes and orbital contents are unremarkable. 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 No hemorrhagic conversion. Critical Value/emergent results were called by telephone at the time of interpretation on 02/24/2015 at 10:36 pm to Dr. Nicole Kindred, NEUROLOGY, who verbally acknowledged these results. Electronically Signed   By: Elon Alas M.D.   On: 02/24/2015 22:38     STUDIES:  CT head 12/13 > early findings of R MCA territory infarct including suspected thromboembolic R MCA.  CULTURES: None.  ANTIBIOTICS: None.  SIGNIFICANT EVENTS: 12/13 > brought to [facility] as code stroke, taken to IR but no acute findings so no interventinons.  LINES/TUBES: ETT 12/13 >   ASSESSMENT / PLAN:  NEUROLOGIC A:    Concern for R MCA infarct - s/p neuro IR where there were no acute findings and no interventions performed. Acute metabolic encephalopathy due to sedation. ETOH use disorder. P:    Neuro following. Sedation: Propofol gtt/ Fentanl PRN. RASS goal: 0 to -1. Daily WUA. Thiamine / Folate. F/u CT head and MRI brain.  PULMONARY A: VDRF due to inability to protect airway in setting of presumed acute CVA. Extubated  P:    Extubated overnight Push pulm hygiene   CARDIOVASCULAR A:   HTN. Chest discomfort. CXR, CT-PA, ECG reassuring. Appears to be GI in nature P:   Labetalol PRN for goal SBP < 180 per neuro. F/u echo. Follow chest sx, suspect GERD / GI   RENAL A:    Hypocalcemia. P:    1g Ca gluconate. BMP in AM.  GASTROINTESTINAL A:    GERD, abdominal discomfort Nutrition. P:    SUP: Pantoprazole. NPO for now. Would try GI cocktail when he is cleared to swallow after CVA screening.  Consider GI eval and workup if the discomfort continues.   HEMATOLOGIC A:    VTE prophylaxis. P:   SCD's. CBC in AM.  INFECTIOUS A:    No indication of infection. P:    Monitor  clinically.  ENDOCRINE A:    No acute issues. P:    Monitor glucose on BMP.   Family updated: None.  Interdisciplinary Family Meeting v Palliative Care Meeting:  Due by: 12/20.   Montey Hora, [facility] Pager: [phone]  or [phone] 02/25/2015, 2:17 AM  Attending Note:  I have examined patient, reviewed labs, studies and notes. I have discussed the case with Jun[place] Road[place], and I agree with the data and plans as amended above.  Continues to c/o epigastric pain, tender on exam. ? whether he will need GI eval and workup - he states that endoscopy was recommended as an outpt but he has not had this yet.    Baltazar Apo, MD, PhD 02/25/2015, 8:17 AM [facility] [phone] or if no answer [phone]

## 2015-02-25 NOTE — Transfer of Care (Signed)
Immediate Anesthesia Transfer of Care Note  Patient: Richard Soto  Procedure(s) Performed: Procedure(s): RADIOLOGY WITH ANESTHESIA (N/A)  Patient Location: NICU  Anesthesia Type:General  Level of Consciousness: awake  Airway & Oxygen Therapy: Patient remains intubated per anesthesia plan and Patient placed on Ventilator (see vital sign flow sheet for setting)  Post-op Assessment: Report given to RN and Post -op Vital signs reviewed and stable  Post vital signs: Reviewed and stable  Last Vitals:  Filed Vitals:   02/25/15 1100 02/25/15 1200  BP: 146/81 131/86  Pulse: 91 80  Temp:  37.2 C  Resp: 23 23    Complications: No apparent anesthesia complications

## 2015-02-25 NOTE — H&P (Signed)
Admission H&P    Chief Complaint: New onset left-sided weakness and numbness.  HPI: Richard Soto is an 49 y.o. male with a history of hypertension, pulmonary embolus and hyperlipidemia, brought to the emergency room following onset of weakness and numbness involving his left side as well as slurred speech. He was last known well at 6 PM on 02/24/2015. He has no previous history of stroke nor TIA. Anticoagulation was discontinued one month prior to admission. He was not on antiplatelet therapy. CT scan of his head showed increased density of distal right M1 MCA. There was also early signs of possible ischemic changes in right MCA territory. NIH stroke score was 15. A pressure was elevated requiring intervention with intravenous labetalol. Patient was beyond time window for treatment consideration with IV TPA. Interventional radiology was consulted and patient underwent catheter cerebral angiogram. Study showed no occlusion or severe stenosis involving right ICA nor MCA. Patient was transferred to neurology intensive care unit for further management.  LSN: 6 PM on 02/24/2015 tPA Given: No: Beyond time under for treatment consideration mRankin:  Past Medical History  Diagnosis Date  . Pulmonary embolism [facility])     Past Surgical History  Procedure Laterality Date  . Replacement total knee      History reviewed. No pertinent family history. Social History:  reports that he has been smoking.  He does not have any smokeless tobacco history on file. He reports that he does not drink alcohol. His drug history is not on file.  Allergies: No Known Allergies  Medications Prior to Admission  Medication Sig Dispense Refill  . amLODipine (NORVASC) 10 MG tablet Take 10 mg by mouth daily.    Marland Kitchen aspirin EC 81 MG tablet Take 81 mg by mouth daily.    . Omega-3 Fatty Acids (FISH OIL) 1000 MG CAPS Take 1 capsule by mouth daily.    . traZODone (DESYREL) 100 MG tablet Take 50 mg by mouth at bedtime.       ROS: History obtained from patient's brother  General ROS: negative for - chills, fatigue, fever, night sweats, weight gain or weight loss Psychological ROS: negative for - behavioral disorder, hallucinations, memory difficulties, mood swings or suicidal ideation Ophthalmic ROS: negative for - blurry vision, double vision, eye pain or loss of vision ENT ROS: negative for - epistaxis, nasal discharge, oral lesions, sore throat, tinnitus or vertigo Allergy and Immunology ROS: negative for - hives or itchy/watery eyes Hematological and Lymphatic ROS: negative for - bleeding problems, bruising or swollen lymph nodes Endocrine ROS: negative for - galactorrhea, hair pattern changes, polydipsia/polyuria or temperature intolerance Respiratory ROS: negative for - cough, hemoptysis, shortness of breath or wheezing Cardiovascular ROS: negative for - chest pain, dyspnea on exertion, edema or irregular heartbeat Gastrointestinal ROS: negative for - abdominal pain, diarrhea, hematemesis, nausea/vomiting or stool incontinence Genito-Urinary ROS: negative for - dysuria, hematuria, incontinence or urinary frequency/urgency Musculoskeletal ROS: negative for - joint swelling or muscular weakness Neurological ROS: as noted in HPI Dermatological ROS: negative for rash and skin lesion changes  Physical Examination: Blood pressure 175/117, pulse 65, temperature 98.4 F (36.9 C), temperature source Oral, resp. rate 15, weight 93.3 kg (205 lb 11 oz), SpO2 100 %.  HEENT-  Normocephalic, no lesions, without obvious abnormality.  Normal external eye and conjunctiva.  Normal TM's bilaterally.  Normal auditory canals and external ears. Normal external nose, mucus membranes and septum.  Normal pharynx. Neck supple with no masses, nodes, nodules or enlargement. Cardiovascular - regular rate and  rhythm, S1, S2 normal, no murmur, click, rub or gallop Lungs - chest clear, no wheezing, rales, normal symmetric air  entry Abdomen - soft, non-tender; bowel sounds normal; no masses,  no organomegaly Extremities - no joint deformities, effusion, or inflammation and no edema  Neurologic Examination: Mental Status: Alert, intermittent confusion, no acute distress.  Speech moderately dysarthric. Able to follow commands with use of right extremities only Cranial Nerves: II-left homonymous hemianopsia. III/IV/VI-Pupils were equal and reacted. Extraocular movements were full and conjugate.    V/VII-left facial numbness and mild left lower facial weakness. VIII-normal. X-speech was moderately dysarthric. XI: trapezius strength/neck flexion strength normal bilaterally XII-midline tongue extension with normal strength. Motor: Flaccid paralysis of left upper and lower extremities: Normal strength and tone of right extremities Sensory: Absent response to tactile stimulation over left extremities. Deep Tendon Reflexes: 2+ and symmetric. Plantars: Extensor on the left and flexor on the right Carotid auscultation: Normal  Results for orders placed or performed during the hospital encounter of 02/24/15 (from the past 48 hour(s))  CBG monitoring, ED     Status: Abnormal   Collection Time: 02/24/15 10:21 PM  Result Value Ref Range   Glucose-Capillary 108 (H) 65 - 99 mg/dL  I-stat troponin, ED (not at [facility],  [facility])     Status: None   Collection Time: 02/24/15 10:27 PM  Result Value Ref Range   Troponin i, poc 0.01 0.00 - 0.08 ng/mL   Comment 3            Comment: Due to the release kinetics of cTnI, a negative result within the first hours of the onset of symptoms does not rule out myocardial infarction with certainty. If myocardial infarction is still suspected, repeat the test at appropriate intervals.   I-Stat Chem 8, ED  (not at [facility], [facility])     Status: Abnormal   Collection Time: 02/24/15 10:29 PM  Result Value Ref Range   Sodium 144 135 - 145 mmol/L   Potassium 4.0 3.5 - 5.1 mmol/L   Chloride 108 101 -  111 mmol/L   BUN 5 (L) 6 - 20 mg/dL   Creatinine, Ser 0.90 0.61 - 1.24 mg/dL   Glucose, Bld 95 65 - 99 mg/dL   Calcium, Ion 1.08 (L) 1.12 - 1.23 mmol/L   TCO2 19 0 - 100 mmol/L   Hemoglobin 17.3 (H) 13.0 - 17.0 g/dL   HCT 51.0 39.0 - 52.0 %  Protime-INR     Status: None   Collection Time: 02/24/15 10:33 PM  Result Value Ref Range   Prothrombin Time 13.9 11.6 - 15.2 seconds   INR 1.05 0.00 - 1.49  APTT     Status: None   Collection Time: 02/24/15 10:33 PM  Result Value Ref Range   aPTT 35 24 - 37 seconds  CBC     Status: Abnormal   Collection Time: 02/24/15 10:33 PM  Result Value Ref Range   WBC 10.1 4.0 - 10.5 K/uL   RBC 5.24 4.22 - 5.81 MIL/uL   Hemoglobin 17.2 (H) 13.0 - 17.0 g/dL   HCT 50.2 39.0 - 52.0 %   MCV 95.8 78.0 - 100.0 fL   MCH 32.8 26.0 - 34.0 pg   MCHC 34.3 30.0 - 36.0 g/dL   RDW 12.4 11.5 - 15.5 %   Platelets 281 150 - 400 K/uL  Differential     Status: None   Collection Time: 02/24/15 10:33 PM  Result Value Ref Range   Neutrophils Relative % 55 %  Neutro Abs 5.5 1.7 - 7.7 K/uL   Lymphocytes Relative 37 %   Lymphs Abs 3.7 0.7 - 4.0 K/uL   Monocytes Relative 6 %   Monocytes Absolute 0.6 0.1 - 1.0 K/uL   Eosinophils Relative 2 %   Eosinophils Absolute 0.2 0.0 - 0.7 K/uL   Basophils Relative 0 %   Basophils Absolute 0.0 0.0 - 0.1 K/uL  Comprehensive metabolic panel     Status: Abnormal   Collection Time: 02/24/15 10:33 PM  Result Value Ref Range   Sodium 141 135 - 145 mmol/L   Potassium 4.0 3.5 - 5.1 mmol/L   Chloride 108 101 - 111 mmol/L   CO2 19 (L) 22 - 32 mmol/L   Glucose, Bld 98 65 - 99 mg/dL   BUN <5 (L) 6 - 20 mg/dL   Creatinine, Ser 0.75 0.61 - 1.24 mg/dL   Calcium 9.9 8.9 - 10.3 mg/dL   Total Protein 8.1 6.5 - 8.1 g/dL   Albumin 4.5 3.5 - 5.0 g/dL   AST 29 15 - 41 U/L   ALT 42 17 - 63 U/L   Alkaline Phosphatase 123 38 - 126 U/L   Total Bilirubin 0.3 0.3 - 1.2 mg/dL   GFR calc non Af Amer >60 >60 mL/min   GFR calc Af Amer >60 >60 mL/min     Comment: (NOTE) The eGFR has been calculated using the CKD EPI equation. This calculation has not been validated in all clinical situations. eGFR's persistently <60 mL/min signify possible Chronic Kidney Disease.    Anion gap 14 5 - 15  Ethanol     Status: Abnormal   Collection Time: 02/24/15 11:34 PM  Result Value Ref Range   Alcohol, Ethyl (B) 273 (H) <5 mg/dL    Comment:        LOWEST DETECTABLE LIMIT FOR SERUM ALCOHOL IS 5 mg/dL FOR MEDICAL PURPOSES ONLY   I-STAT 3, arterial blood gas (G3+)     Status: Abnormal   Collection Time: 02/25/15  2:33 AM  Result Value Ref Range   pH, Arterial 7.257 (L) 7.350 - 7.450   pCO2 arterial 41.9 35.0 - 45.0 mmHg   pO2, Arterial 399.0 (H) 80.0 - 100.0 mmHg   Bicarbonate 18.8 (L) 20.0 - 24.0 mEq/L   TCO2 20 0 - 100 mmol/L   O2 Saturation 100.0 %   Acid-base deficit 8.0 (H) 0.0 - 2.0 mmol/L   Patient temperature 97.4 F    Sample type ARTERIAL    Ct Head Wo Contrast  02/24/2015  CLINICAL DATA:  LEFT arm and leg weakness, nonverbal; last seen normal at 6 p.m. History of pulmonary embolism. Assess stroke. EXAM: CT HEAD WITHOUT CONTRAST TECHNIQUE: Contiguous axial images were obtained from the base of the skull through the vertex without intravenous contrast. COMPARISON:  None. FINDINGS: Subtle apparent RIGHT insular ribbon sign with RIGHT insular dot sign. Ventricles and sulci are overall normal for age. No midline shift, mass effect. No abnormal extra-axial fluid collections. Basal cisterns are patent. 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 Moderate calcific atherosclerosis of the carotid siphons. Paranasal sinuses and mastoid air cells are well aerated. Ocular globes and orbital contents are unremarkable. 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 No hemorrhagic conversion. Critical Value/emergent results were called by telephone at the time of interpretation on 02/24/2015 at 10:36 pm  to Dr. Nicole Kindred, NEUROLOGY, who verbally acknowledged these results. Electronically Signed   By: Elon Alas M.D.   On:  02/24/2015 22:38    Assessment: 49 y.o. male with multiple risk factors for stroke presenting with acute right MCA territory ischemic infarction as well as hypertensive urgency.  Stroke Risk Factors - hyperlipidemia and hypertension  Plan: 1. Admission to neuro intensive care unit for further management 2. MRI, MRA  of the brain without contrast 3. PT consult, OT consult, Speech consult 4. Echocardiogram 5. Carotid dopplers 6. Prophylactic therapy-Antiplatelet med: Aspirin  7. Risk factor modification 8. HgbA1c, fasting lipid panel 9. Hypercoagulopathy panel  This patient is critically ill and at significant risk of neurological worsening or death, and care requires constant monitoring of vital signs, hemodynamics,respiratory and cardiac monitoring, neurological assessment, discussion with family, other specialists and medical decision making of high complexity. Total critical care time was 90 minutes.  C.R. Nicole Kindred, MD [facility] [phone]  02/25/2015, 2:36 AM

## 2015-02-25 NOTE — Procedures (Signed)
Extubation Procedure Note  Patient Details:   Name: Richard Soto DOB: 20-Oct-1965 MRN: [id number]   Airway Documentation:  Airway 8 mm (Active)  Secured at (cm) 25 cm 02/25/2015 12:59 AM  Measured From Lips 02/25/2015 12:59 AM  Secured Location Right 02/25/2015 12:59 AM  Secured By [employer] 02/25/2015 12:59 AM  Tube Holder Repositioned Yes 02/25/2015 12:59 AM  Site Condition Dry 02/25/2015 12:59 AM    Evaluation  O2 sats: stable throughout Complications: No apparent complications Patient did tolerate procedure well.   Suctioning: Airway Yes   Patient extubated without any complications. Patient was able to speak and had a strong cough. Patient was placed on [facility].  Blanchie Serve 02/25/2015, 3:00 AM

## 2015-02-25 NOTE — Procedures (Signed)
02/25/15 Neuro IR LT 4 french femoral sheath removed at 1345.  VPAD and manual pressure applied to achieve hemostasis at 1400.  Distal pulses intact.  Site reviewed with Arlyss Queen.  Pressure dressing and sandbag applied.  [place] RT-R Tillman Sers RT-R

## 2015-02-25 NOTE — Progress Notes (Signed)
0141 pt began doing a jerking movement in all extremities. Rahul at bedside. Orders given for 2 mg Ativan.   0205 Pt began to wake up, moving all extremities and attempting to pull at ETT and bending right leg where groin intervention was performed. 100 mg of fentanyl given. Shortly after patients BP began to drop with SBP in 60's.Propofol was paused. Hit Elink button. Orders given for 1 L NS bolus. BP recovered. Pt woke up again lunging up in bed and attempting to pull ETT. Orders given to extubate. Extubated at 0250. Patient appears to be doing well on [place]. Alert and conversing.  0420 pt began c/o chest pain and not being able to take a deep breath. Elink called again. Orders given for stat 12 lead EKG, Stat chest xray, stat nitro SL, stat chest CT. Rahul at bedside. Pt started on Bipap.

## 2015-02-25 NOTE — Progress Notes (Signed)
Patient transported to CT by RN, RT, and transport,on BIPAP without any complications.

## 2015-02-25 NOTE — Progress Notes (Signed)
STROKE TEAM PROGRESS NOTE   HISTORY Richard Soto is an 49 y.o. male with a history of hypertension, pulmonary embolus and hyperlipidemia, brought to the emergency room following onset of weakness and numbness involving his left side as well as slurred speech. He was last known well at 6 PM on 02/24/2015. He has no previous history of stroke nor TIA. Anticoagulation was discontinued one month prior to admission. He was not on antiplatelet therapy. CT scan of his head showed increased density of distal right M1 MCA. There was also early signs of possible ischemic changes in right MCA territory. NIH stroke score was 15. A pressure was elevated requiring intervention with intravenous labetalol. Patient was beyond time window for treatment consideration with IV TPA. Interventional radiology was consulted and patient underwent catheter cerebral angiogram. Study showed no occlusion or severe stenosis involving right ICA nor MCA. Patient was transferred to neurology intensive care unit for further management.   SUBJECTIVE (INTERVAL HISTORY) His RN is at the bedside.  Overall he feels his condition is stable. Complains of knee pain. Wants something to drink.   OBJECTIVE Temp:  [97.2 F (36.2 C)-98.8 F (37.1 C)] 98.8 F (37.1 C) (12/14 0800) Pulse Rate:  [65-102] 92 (12/14 0900) Cardiac Rhythm:  [-] Sinus tachycardia (12/14 0800) Resp:  [10-35] 22 (12/14 0900) BP: (78-175)/(47-117) 136/74 mmHg (12/14 0900) SpO2:  [94 %-100 %] 96 % (12/14 0900) Arterial Line BP: (69-156)/(37-79) 156/75 mmHg (12/14 0900) FiO2 (%):  [50 %-100 %] 50 % (12/14 0446) Weight:  [93.3 kg (205 lb 11 oz)] 93.3 kg (205 lb 11 oz) (12/13 2237)  CBC:   Recent Labs Lab 02/24/15 2233 02/25/15 0414  WBC 10.1 11.0*  NEUTROABS 5.5  --   HGB 17.2* 13.9  HCT 50.2 41.9  MCV 95.8 97.9  PLT 281 [id number]    Basic Metabolic Panel:   Recent Labs Lab 02/24/15 2233 02/25/15 0414  NA 141 141  K 4.0 4.3  CL 108 111  CO2 19* 17*   GLUCOSE 98 101*  BUN <5* 6  CREATININE 0.75 0.87  CALCIUM 9.9 8.6*  MG  --  1.8  PHOS  --  4.2    Lipid Panel:     Component Value Date/Time   TRIG 162* 02/25/2015 0414   HgbA1c: No results found for: HGBA1C Urine Drug Screen: No results found for: LABOPIA, COCAINSCRNUR, LABBENZ, AMPHETMU, THCU, LABBARB    IMAGING  Ct Head Wo Contrast 02/24/2015   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 No hemorrhagic conversion.   Ct Angio Chest Pe W/cm &/or Wo Cm 02/25/2015  1. No evidence of significant pulmonary embolus. 2. Minimal bilateral atelectasis noted.  Lungs otherwise clear. 3. Scattered coronary artery calcifications seen.   Dg Chest Port 1 View 02/25/2015  Mild peribronchial thickening noted.  Lungs otherwise clear.       PHYSICAL EXAM Obese middle-aged [place] middle aged african [place] male not in distress. . Afebrile. Head is nontraumatic. Neck is supple without bruit.    Cardiac exam no murmur or gallop. Lungs are clear to auscultation. Distal pulses are well felt. Neurological Exam :   Awake alert oriented 3. Mild dysarthria. No aphasia. Follows commands well. Right gaze preference but able to look to the left past midline. Decreased blink to threat on the left compared to the right. Pupils equal reactive. Fundi were not visualized. Vision acuity seems adequate. The left lower facial weakness. Tongue midline. Dense left hemiplegia with grade 2/5  strength in the left upper extremity with significant weakness of the grip and distal hand muscles. 0/5 left lower extremity weakness with trace withdrawal to pain. Left plantar upgoing right downgoing. Tone diminished on the left and normal on the right. Deep tendon reflexes 2+ on the right and absent on the left. Gait was not tested.  ASSESSMENT/PLAN Richard Soto is a 49 y.o. male with history of hypertension, pulmonary embolus and hyperlipidemia presenting with left sided  weakness and numbness and slurred speech. He did not receive IV t-PA due to beyond treatment window; he had no large vessel occlusion on catheter angiogram.   Stroke:  Non-dominant right MCA infarct (suspect subinsular infarct, MRI pending) embolic secondary to unknown source  Resultant  Left hemiparesis, dysarthria  Catheter angiogram no large vessel occlusion of right ICA MCA (intubated for procedure)  MRI  pending   2D Echo  pending   LDL ordered  HgbA1c pending  Consider TEE depending on MRI results  Consider hypercoagulable testing  SCDs for VTE prophylaxis. Change to lovenox Diet NPO time specified  No antithrombotic prior to admission,ORDERED ONE TIME DOSE OF aspirin 325 mg daily WHICH HE DID NOT RECEIVE. Take once able to swallow  Ongoing aggressive stroke risk factor management  Therapy recommendations:  Pending.   Ok to transfer to floor once a-line out  Disposition:  pending   Hypertension  Stable  Permissive hypertension (OK if < 220/120) but gradually normalize in 5-7 days  Hyperlipidemia  Home meds:  OMEGA-3 FATTY ACIDS  LDL pending , goal < 70  Other Stroke Risk Factors  Cigarette smoker, advised to stop smoking  ETOH use per EMR - pt denies  UDS ordered  Other Active Problems  01/2015 - [facility] History of pulmonary embolism suspected to have come from his RLE, with anticoagulation stopped due to GIB. LE doppler neg at that time. Colonoscopy planned. - notes they were unable to contact him for follow up.  R knee pain. Paradoxical response to fentanyl yesterday. Once able to swallow, will try tramadol vs tylenol #3  Hospital day # [place] for Pager information 02/25/2015 10:00 AM  I have personally examined this patient, reviewed notes, independently viewed imaging studies, participated in medical decision making and plan of care. I have made any additions or clarifications directly to the above note.  Agree with note above. For TPA and catheter angiogram did not reveal any clot emanating with endovascular treatment. He he continues to have significant left hemiplegia and suspect right brain moderate-sized subcortical infarct. He remains at risk for neurological worsening, recurrent stroke, TIA needs ongoing evaluation. Plan transfer to the neurology stroke unit to outside ICU and discontinue the arterial line. Check MRI scan echocardiogram lipid studies. No family at [place], [place] Pager: [phone] 02/25/2015 6:38 PM    To contact Stroke Continuity provider, please refer to [email or web address] After hours, contact General Neurology

## 2015-02-25 NOTE — Progress Notes (Signed)
eLink Physician-Brief Progress Note Patient Name: Richard Soto DOB: 04-May-1965 MRN: [id number]   Date of Service  02/25/2015  HPI/Events of Note  Patient extubated, was talking and appropriate for an 1 hour, then complained of intense chest pain, pressure, with sob  eICU Interventions  Cxr, CEx3, SL NTG, EKG Given morphine Review of chart shows PE at [facility], was on xarelto as an outpt Stat Chest CT     Intervention Category Major Interventions: Other:  Darnelle Derrick 02/25/2015, 4:24 AM

## 2015-02-25 NOTE — Progress Notes (Signed)
  Echocardiogram 2D Echocardiogram has been performed.  Richard Soto M 02/25/2015, 4:45 PM

## 2015-02-25 NOTE — Anesthesia Procedure Notes (Signed)
Procedure Name: Intubation Date/Time: 02/25/2015 11:46 PM Performed by: Manuela Schwartz B Pre-anesthesia Checklist: Patient identified, Emergency Drugs available, Suction available, Patient being monitored and Timeout performed Patient Re-evaluated:Patient Re-evaluated prior to inductionOxygen Delivery Method: Circle system utilized Intubation Type: IV induction, Rapid sequence and Cricoid Pressure applied Laryngoscope Size: Mac and 3 Grade View: Grade I Tube type: Subglottic suction tube Tube size: 8.0 mm Number of attempts: 1 Airway Equipment and Method: Stylet Placement Confirmation: ETT inserted through vocal cords under direct vision,  positive ETCO2 and breath sounds checked- equal and bilateral Secured at: 22 cm Tube secured with: Tape Dental Injury: Teeth and Oropharynx as per pre-operative assessment

## 2015-02-25 NOTE — Progress Notes (Signed)
Pt c/o b;adder fullness & inability to void.  Foley catheter removed 12/14 @ 03:00 & no void since that time.  Bladder scan = 847ml, I&O cath'd for 971ml, pt reports "I feel better".  Will continue to monitor.

## 2015-02-25 NOTE — Progress Notes (Signed)
[facility] Progress Note Patient Name: Richard Soto DOB: 07-08-1965 MRN: [id number]   Date of Service  02/25/2015  HPI/Events of Note  Patient from PACU with ETT, on vent, severely agitated and requesting the ETT removed  eICU Interventions  Severe agitation, following commands, good vt, plan for extubation.      Intervention Category Major Interventions: Airway management  Taim Wurm 02/25/2015, 2:48 AM

## 2015-02-26 LAB — HEMOGLOBIN A1C
HEMOGLOBIN A1C: 5.8 % — AB (ref 4.8–5.6)
MEAN PLASMA GLUCOSE: 120 mg/dL

## 2015-02-26 MED ORDER — TAMSULOSIN HCL 0.4 MG PO CAPS
0.4000 mg | ORAL_CAPSULE | Freq: Every day | ORAL | Status: DC
  Administered 2015-02-26 – 2015-02-28 (×2): 0.4 mg via ORAL
  Filled 2015-02-26 (×3): qty 1

## 2015-02-26 MED ORDER — FOLIC ACID 1 MG PO TABS
1.0000 mg | ORAL_TABLET | Freq: Every day | ORAL | Status: DC
  Administered 2015-02-27 – 2015-03-02 (×4): 1 mg via ORAL
  Filled 2015-02-26 (×4): qty 1

## 2015-02-26 MED ORDER — TRAZODONE HCL 50 MG PO TABS
50.0000 mg | ORAL_TABLET | Freq: Every day | ORAL | Status: DC
  Administered 2015-02-28 – 2015-03-01 (×3): 50 mg via ORAL
  Filled 2015-02-26 (×4): qty 1

## 2015-02-26 MED ORDER — ATORVASTATIN CALCIUM 10 MG PO TABS
20.0000 mg | ORAL_TABLET | Freq: Every day | ORAL | Status: DC
  Administered 2015-02-26 – 2015-03-01 (×4): 20 mg via ORAL
  Filled 2015-02-26 (×3): qty 2
  Filled 2015-02-26: qty 1

## 2015-02-26 MED ORDER — VITAMIN B-1 100 MG PO TABS
100.0000 mg | ORAL_TABLET | Freq: Every day | ORAL | Status: DC
  Administered 2015-02-27 – 2015-03-02 (×4): 100 mg via ORAL
  Filled 2015-02-26 (×4): qty 1

## 2015-02-26 MED ORDER — SODIUM CHLORIDE 0.9 % IV SOLN
INTRAVENOUS | Status: DC
  Administered 2015-02-26: 21:00:00 via INTRAVENOUS

## 2015-02-26 NOTE — Anesthesia Postprocedure Evaluation (Signed)
Anesthesia Post Note  Patient: Richard Soto  Procedure(s) Performed: Procedure(s) (LRB): RADIOLOGY WITH ANESTHESIA (N/A)  Patient location during evaluation: ICU Anesthesia Type: General Level of consciousness: patient remains intubated per anesthesia plan Pain management: pain level controlled Vital Signs Assessment: post-procedure vital signs reviewed and stable Respiratory status: patient remains intubated per anesthesia plan Cardiovascular status: stable Anesthetic complications: no    Last Vitals:  Filed Vitals:   02/26/15 0800 02/26/15 0835  BP:  151/90  Pulse:  56  Temp: 36.5 C   Resp:  19    Last Pain:  Filed Vitals:   02/26/15 0841  PainSc: 2                  EDWARDS,Daymein Nunnery

## 2015-02-26 NOTE — Evaluation (Addendum)
Speech Language Pathology Evaluation Patient Details Name: Richard Soto MRN: [id number] DOB: Aug 09, 1965 Today's Date: 02/26/2015 Time: [id number] SLP Time Calculation (min) (ACUTE ONLY): 17 min  Problem List:  Patient Active Problem List   Diagnosis Date Noted  . CVA (cerebral infarction) 02/25/2015  . Chest discomfort 02/25/2015  . Left hemiplegia ([place]) 02/25/2015  . Shortness of breath   . Numbness 02/24/2015  . Stroke [facility]) 02/24/2015   Past Medical History:  Past Medical History  Diagnosis Date  . Pulmonary embolism [facility])    Past Surgical History:  Past Surgical History  Procedure Laterality Date  . Replacement total knee    . Radiology with anesthesia N/A 02/24/2015    Procedure: RADIOLOGY WITH ANESTHESIA;  Surgeon: Medication Radiologist, MD;  Location: [facility];  Service: Radiology;  Laterality: N/A;   HPI:  49 y.o. male with a history of hypertension, pulmonary embolus and hyperlipidemia, brought to the emergency room following onset of weakness and numbness involving his left side as well as slurred speech.CT scan of his head showed increased density of distal right M1 MCA. There was also early signs of possible ischemic changes in right MCA territory. S/p catheter cerebral angiogram. Brief intubation for procedure only.    Assessment / Plan / Recommendation Clinical Impression   Pt presents with mild cognitive impairment characterized by decreased functional problem solving and emergent awareness of deficits. Pt also presents with slightly decreased recall of new information and scored an 18 out of 30 on the MoCA standardized cognitive assesment (n>/= 26) which is likely related to lethargy and questionable effort made during evaluation tasks.  Pt's speech was fluent and free from dysarthria or word finding difficulties.  Pt would benefit from brief ST follow up to address cognitive deficits in addition to dysphagia but do not anticipate ST needs post discharge.      SLP  Assessment  Patient needs continued Speech Lanaguage Pathology Services    Follow Up Recommendations   (TBD)    Frequency and Duration min 1 x/week  1 week      SLP Evaluation Prior Functioning  Cognitive/Linguistic Baseline: Within functional limits Type of Home: Other(Comment) (lives with brother per pt's report )  Lives With: Family Education: 11th grade education Vocation: On disability   Cognition  Overall Cognitive Status: Impaired/Different from baseline Arousal/Alertness: Lethargic Orientation Level: Oriented X4 Attention: Sustained Sustained Attention: Appears intact Memory: Impaired Memory Impairment: Decreased recall of new information Awareness: Impaired Awareness Impairment: Emergent impairment Problem Solving: Impaired Problem Solving Impairment: Functional complex Executive Function: Self Monitoring;Self Correcting Self Monitoring: Impaired Self Monitoring Impairment: Functional complex Self Correcting: Impaired Self Correcting Impairment: Functional complex Safety/Judgment: Appears intact    Comprehension  Auditory Comprehension Overall Auditory Comprehension: Appears within functional limits for tasks assessed    Expression Expression Primary Mode of Expression: Verbal Verbal Expression Overall Verbal Expression: Appears within functional limits for tasks assessed   Oral / Motor Oral Motor/Sensory Function Overall Oral Motor/Sensory Function: Mild impairment Facial ROM: Within Functional Limits Facial Symmetry: Within Functional Limits Facial Strength: Within Functional Limits Facial Sensation: Within Functional Limits Lingual ROM: Reduced right;Reduced left Lingual Symmetry: Within Functional Limits Lingual Strength: Reduced Lingual Sensation: Within Functional Limits Velum: Within Functional Limits Motor Speech Overall Motor Speech: Appears within functional limits for tasks assessed    Emilio Math 02/26/2015, 4:14 PM

## 2015-02-26 NOTE — Progress Notes (Signed)
STROKE TEAM PROGRESS NOTE   HISTORY Richard Soto is an 49 y.o. male with a history of hypertension, pulmonary embolus and hyperlipidemia, brought to the emergency room following onset of weakness and numbness involving his left side as well as slurred speech. He was last known well at 6 PM on 02/24/2015. He has no previous history of stroke nor TIA. Anticoagulation was discontinued one month prior to admission. He was not on antiplatelet therapy. CT scan of his head showed increased density of distal right M1 MCA. There was also early signs of possible ischemic changes in right MCA territory. NIH stroke score was 15. A pressure was elevated requiring intervention with intravenous labetalol. Patient was beyond time window for treatment consideration with IV TPA. Interventional radiology was consulted and patient underwent catheter cerebral angiogram. Study showed no occlusion or severe stenosis involving right ICA nor MCA. Patient was transferred to neurology intensive care unit for further management.   SUBJECTIVE (INTERVAL HISTORY) His RN is at the bedside.  She reports that the patient has been having urinary retention. Will start Flomax. The patient reports some difficulties prior to admission as well. Overall he feels his condition is stable; however, there are some inconsistencies in his exam. The patient has been under a great deal of stress recently. His wife has dementia.   OBJECTIVE Temp:  [97.5 F (36.4 C)-98.8 F (37.1 C)] 97.5 F (36.4 C) (12/15 1200) Pulse Rate:  [56-104] 56 (12/15 0835) Cardiac Rhythm:  [-] Sinus bradycardia (12/15 0800) Resp:  [12-24] 19 (12/15 0835) BP: (137-155)/(77-93) 151/90 mmHg (12/15 0835) SpO2:  [94 %-99 %] 95 % (12/15 0835) Weight:  [93.3 kg (205 lb 11 oz)] 93.3 kg (205 lb 11 oz) (12/15 0835)  CBC:   Recent Labs Lab 02/24/15 2233 02/25/15 0414  WBC 10.1 11.0*  NEUTROABS 5.5  --   HGB 17.2* 13.9  HCT 50.2 41.9  MCV 95.8 97.9  PLT 281 221     Basic Metabolic Panel:   Recent Labs Lab 02/24/15 2233 02/25/15 0414  NA 141 141  K 4.0 4.3  CL 108 111  CO2 19* 17*  GLUCOSE 98 101*  BUN <5* 6  CREATININE 0.75 0.87  CALCIUM 9.9 8.6*  MG  --  1.8  PHOS  --  4.2    Lipid Panel:     Component Value Date/Time   CHOL 133 02/25/2015 1133   TRIG 67 02/25/2015 1133   HDL 37* 02/25/2015 1133   CHOLHDL 3.6 02/25/2015 1133   VLDL 13 02/25/2015 1133   LDLCALC 83 02/25/2015 1133   HgbA1c:  Lab Results  Component Value Date   HGBA1C 5.8* 02/25/2015   Urine Drug Screen:     Component Value Date/Time   LABOPIA POSITIVE* 02/25/2015 1643   COCAINSCRNUR NONE DETECTED 02/25/2015 1643   LABBENZ POSITIVE* 02/25/2015 1643   AMPHETMU NONE DETECTED 02/25/2015 1643   THCU NONE DETECTED 02/25/2015 1643   LABBARB NONE DETECTED 02/25/2015 1643      IMAGING  Ct Head Wo Contrast 02/24/2015   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 No hemorrhagic conversion.   Ct Angio Chest Pe W/cm &/or Wo Cm 02/25/2015  1. No evidence of significant pulmonary embolus. 2. Minimal bilateral atelectasis noted.  Lungs otherwise clear. 3. Scattered coronary artery calcifications seen.   Dg Chest Port 1 View 02/25/2015  Mild peribronchial thickening noted.  Lungs otherwise clear.    MRI of Head 02/25/2015 1. Normal MRI appearance the brain.  No evidence for acute or subacute infarction. 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       PHYSICAL EXAM Obese middle-aged [place] middle aged african [place] male not in distress. . Afebrile. Head is nontraumatic. Neck is supple without bruit.    Cardiac exam no murmur or gallop. Lungs are clear to auscultation. Distal pulses are well felt. Neurological Exam :   Awake alert oriented 3. Mild dysarthria. No aphasia. Follows commands well. Extraocular movements are full range  no field cut. Pupils equal reactive. Fundi were  not visualized. Vision acuity seems adequate. No facial weakness. Tongue midline. Dense left hemiplegia with grade 2/5 strength in the left upper extremity with significant weakness of the grip and distal hand muscles2/5 left lower extremity weakness with trace withdrawal to pain extremely poor and variable effort while testing the left side. Will sign is positive. Subjective diminished touch pinprick and vibration sensation on the left hemibody including forehead with splitting of the midline for touch as well as vibration sensation over the forehead. Left plantar upgoing right downgoing.  Deep tendon reflexes 2+ on the right and absent on the left. Gait was not tested.  ASSESSMENT/PLAN [name] is a 49 y.o. male with history of hypertension, pulmonary embolus and hyperlipidemia presenting with left sided weakness and numbness and slurred speech. He did not receive IV t-PA due to beyond treatment window; he had no large vessel occlusion on catheter angiogram.    Stroke like symptoms possibly nonorganic etiology with negative MRI scan   Resultant  Left hemiparesis, dysarthria with nonorganic features  Catheter angiogram no large vessel occlusion of right ICA MCA (intubated for procedure)  MRI  negative for infarct.  2D Echo EF 60-65%. No cardiac source of emboli identified.  LDL 83  HgbA1c 5.8  Consider TEE depending on MRI results  Consider hypercoagulable testing  SCDs for VTE prophylaxis. Change to lovenox DIET DYS 3 Room service appropriate?: Yes; Fluid consistency:: Thin  No antithrombotic prior to admission,ORDERED ONE TIME DOSE OF aspirin 325 mg daily WHICH HE DID NOT RECEIVE. Take once able to swallow  Ongoing aggressive stroke risk factor management  Therapy recommendations:  Home health physical therapy recommended.  Ok to transfer to floor once a-line out  Disposition:  pending   Hypertension  Stable  Permissive hypertension (OK if < 220/120) but gradually  normalize in 5-7 days  Hyperlipidemia  Home meds:  OMEGA-3 FATTY ACIDS  LDL 83 , goal < 70  Start low-dose Lipitor  Continue statin at time of discharge.  Other Stroke Risk Factors  Cigarette smoker, advised to stop smoking  ETOH use per EMR - pt denies  UDS - positive for opiates and benzodiazepines.  Other Active Problems  01/2015 - [facility] History of pulmonary embolism suspected to have come from his RLE, with anticoagulation stopped due to GIB. LE doppler neg at that time. Colonoscopy planned. - notes they were unable to contact him for follow up.     Paradoxical response to fentanyl yesterday. Once able to swallow, will try tramadol vs tylenol #3  Urinary retention - Flomax started 3 days.  Hospital day # 2  Mikey Bussing PA-C [facility] Pager [phone] 02/26/2015, 1:03 PM  I have personally examined this patient, reviewed notes, independently viewed imaging studies, participated in medical decision making and plan of care. I have made any additions or clarifications directly to the above note. Agree with note above. For TPA and catheter angiogram did  not reveal any clot emanating with endovascular treatment. He he continues to have significant left hemiplegia shows no definite stroke and his exam has normal organic findings raising concern for underlying psychosocial stressors. Plan transfer to the neurology stroke unit to outside ICU and  mobilize out of bed and therapy consults.. No family at bedside  Antony Contras, MD  To contact Stroke Continuity provider, please refer to [email or web address] After hours, contact General Neurology

## 2015-02-26 NOTE — Progress Notes (Signed)
Pt refusing BiPAP at this time, pt is resting comfortably on nasal cannula. Vital signs within normal range, RT will monitor as needed

## 2015-02-26 NOTE — Evaluation (Signed)
Physical Therapy Evaluation Patient Details Name: Richard Soto MRN: [id number] DOB: [birth year] Today's Date: 02/26/2015   History of Present Illness  pt presents with L sided weakness and negative MRI.  pt positive for Etoh, Benzos, and Opiates on admit.  pt with hx of HTN and PE.    Clinical Impression  Pt demonstrates weakness on L side, but question effort.  Pt able to stand and attempted to use urinal with only guarding A in standing and then able to move LE through pivot to recliner.  Will continue to follow.      Follow Up Recommendations Home health PT;Supervision/Assistance - 24 hour    Equipment Recommendations  Rolling walker with 5" wheels    Recommendations for Other Services       Precautions / Restrictions Precautions Precautions: Fall Restrictions Weight Bearing Restrictions: No      Mobility  Bed Mobility Overal bed mobility: Needs Assistance Bed Mobility: Supine to Sit     Supine to sit: Min assist     General bed mobility comments: A with L LE only.    Transfers Overall transfer level: Needs assistance Equipment used: 1 person hand held assist Transfers: Sit to/from [facility] Sit to Stand: Min assist Stand pivot transfers: Min assist       General transfer comment: pt does tend to lean to R side and avoids attempting to WB on L LE.  pt able to take steps with L LE towards recliner.    Ambulation/Gait                Stairs            Wheelchair Mobility    Modified Rankin (Stroke Patients Only) Modified Rankin (Stroke Patients Only) Pre-Morbid Rankin Score: No symptoms Modified Rankin: Severe disability     Balance Overall balance assessment: Needs assistance Sitting-balance support: No upper extremity supported;Feet supported Sitting balance-Leahy Scale: Good     Standing balance support: No upper extremity supported;During functional activity Standing balance-Leahy Scale: Fair                                Pertinent Vitals/Pain Pain Assessment: No/denies pain    Home Living Family/patient expects to be discharged to:: Unsure Living Arrangements: Other relatives               Additional Comments: pt lives in [place], [place] and is staying with his brother and sister-in-law locally.  Brother's home is one level with a "few" steps to enter and pt is unsure if there is a hand rail.  pt indicates he only has crutches.      Prior Function Level of Independence: Independent               Hand Dominance        Extremity/Trunk Assessment   Upper Extremity Assessment: Defer to OT evaluation           Lower Extremity Assessment: LLE deficits/detail   LLE Deficits / Details: difficult to assess and question effort.  pt indicates sensation feels "weird".  pt indicates he can feel, but question sensation.    Cervical / Trunk Assessment: Normal  Communication   Communication: No difficulties  Cognition Arousal/Alertness: Awake/alert Behavior During Therapy: WFL for tasks assessed/performed Overall Cognitive Status: Within Functional Limits for tasks assessed  General Comments      Exercises        Assessment/Plan    PT Assessment Patient needs continued PT services  PT Diagnosis Difficulty walking;Hemiplegia non-dominant side   PT Problem List Decreased strength;Decreased activity tolerance;Decreased balance;Decreased mobility;Decreased coordination;Decreased knowledge of use of DME;Impaired sensation  PT Treatment Interventions DME instruction;Gait training;Stair training;Functional mobility training;Therapeutic activities;Therapeutic exercise;Balance training;Neuromuscular re-education;Patient/family education   PT Goals (Current goals can be found in the Care Plan section) Acute Rehab PT Goals Patient Stated Goal: Get better. PT Goal Formulation: With patient Time For Goal Achievement: 03/12/15 Potential to Achieve  Goals: Good    Frequency Min 4X/week   Barriers to discharge Decreased caregiver support      Co-evaluation               End of Session Equipment Utilized During Treatment: Gait belt Activity Tolerance: Patient tolerated treatment well Patient left: in chair;with call bell/phone within reach;with chair alarm set Nurse Communication: Mobility status         Time: [id number] PT Time Calculation (min) (ACUTE ONLY): 23 min   Charges:   PT Evaluation $Initial PT Evaluation Tier I: 1 Procedure PT Treatments $Therapeutic Activity: 8-22 mins   PT G CodesCatarina Hartshorn, [place] 02/26/2015, 12:45 PM

## 2015-02-26 NOTE — Progress Notes (Signed)
Speech Language Pathology Treatment: Dysphagia  Patient Details Name: Richard Soto MRN: [id number] DOB: 09-20-1965 Today's Date: 02/26/2015 Time: [id number] SLP Time Calculation (min) (ACUTE ONLY): 8 min  Assessment / Plan / Recommendation Clinical Impression  Pt was seen for skilled ST targeting dysphagia goals.  SLP facilitated the session with skilled observations completed during presentations of dys 3 textures and thin liquids via straw to assess diet toleration.  Pt demonstrated slowed but effective mastication of solids with complete clearance of residual solids from the oral cavity with mod I.  No overt s/s of aspiration evident with thin liquids despite pt being challenged with large, consecutive boluses via straw.  Pt was lethargic during POs; therefore, recommend that pt remain on dys 3 textures and thin liquids until alertness consistently improves.  SLP will follow up for additional trials of advanced textures and diet progression.   SLP also administered a cognitive-linguistic evaluation on this date.  See eval.     HPI HPI: 49 y.o. male with a history of hypertension, pulmonary embolus and hyperlipidemia, brought to the emergency room following onset of weakness and numbness involving his left side as well as slurred speech.CT scan of his head showed increased density of distal right M1 MCA. There was also early signs of possible ischemic changes in right MCA territory. S/p catheter cerebral angiogram. Brief intubation for procedure only.       SLP Plan  Continue with current plan of care     Recommendations  Diet recommendations: Dysphagia 3 (mechanical soft);Thin liquid Medication Administration: Whole meds with liquid Supervision: Patient able to self feed;Full supervision/cueing for compensatory strategies Compensations: Slow rate;Small sips/bites;Monitor for anterior loss;Lingual sweep for clearance of pocketing              Oral Care Recommendations: Oral care  BID Follow up Recommendations:  (TBD) Plan: Continue with current plan of care   Dakiya Puopolo, Selinda Orion 02/26/2015, 4:02 PM

## 2015-02-27 DIAGNOSIS — G8194 Hemiplegia, unspecified affecting left nondominant side: Principal | ICD-10-CM

## 2015-02-27 NOTE — Consult Note (Signed)
Physical Medicine and Rehabilitation Consult Reason for Consult: Question CVA Referring Physician: Dr. Leonie Man   HPI: Bentley Yokel is a 49 y.o. right handed male with history of hypertension, left total knee replacement, pulmonary emboli maintain on aspirin 81 mg daily. Patient lives with his brother and sister-in-law in [place]. Independent prior to admission unemployed. One level home 3 steps to entry Presented 02/25/2015 with acute onset of left-sided weakness and numbness as well as slurred speech. Alcohol level 273 on admission as well as positive benzos and opiates. CT of the head showed increased density of distal right M1 MCA. Early signs of possible ischemic changes right MCA territory. Patient did not receive TPA. Cerebral angiogram showed no occlusion or stenosis involving ICA nor MCA. MRI of the brain was negative for any evidence of acute change. Echocardiogram ejection fraction 65% no wall motion abnormalities. Neurology consulted placed on aspirin 325 mg daily for CVA prophylaxis. Subcutaneous Lovenox for DVT prophylaxis. Currently on a mechanical soft thin liquid diet. Physical therapy evaluation completed. M.D. has requested physical medicine rehabilitation consult.   Review of Systems  Constitutional: Negative for fever and chills.  HENT: Negative for hearing loss.        Occasional headache  Eyes: Negative for blurred vision and double vision.  Respiratory: Positive for cough. Negative for shortness of breath.   Cardiovascular: Negative for chest pain, palpitations and leg swelling.  Gastrointestinal: Positive for constipation. Negative for nausea and vomiting.  Genitourinary: Positive for urgency. Negative for dysuria and hematuria.  Musculoskeletal: Positive for myalgias and joint pain.  Skin: Negative for rash.  Neurological: Negative for seizures and loss of consciousness.  All other systems reviewed and are negative.  Past Medical History    Diagnosis Date  . Pulmonary embolism [facility])    Past Surgical History  Procedure Laterality Date  . Replacement total knee    . Radiology with anesthesia N/A 02/24/2015    Procedure: RADIOLOGY WITH ANESTHESIA;  Surgeon: Medication Radiologist, MD;  Location: [facility];  Service: Radiology;  Laterality: N/A;   History reviewed. No pertinent family history. Social History:  reports that he has been smoking.  He does not have any smokeless tobacco history on file. He reports that he does not drink alcohol. His drug history is not on file. Allergies: No Known Allergies Medications Prior to Admission  Medication Sig Dispense Refill  . amLODipine (NORVASC) 10 MG tablet Take 10 mg by mouth daily.    Marland Kitchen aspirin EC 81 MG tablet Take 81 mg by mouth daily.    . Multiple Vitamin (MULTIVITAMIN) tablet Take 1 tablet by mouth daily.    . Omega-3 Fatty Acids (FISH OIL) 1000 MG CAPS Take 1 capsule by mouth daily.    . traZODone (DESYREL) 100 MG tablet Take 50 mg by mouth at bedtime.      Home: Home Living Family/patient expects to be discharged to:: Unsure Living Arrangements: Other relatives Type of Home: Other(Comment) (lives with brother per pt's report ) Additional Comments: pt lives in [place], [place] and is staying with his brother and sister-in-law locally.  Brother's home is one level with a "few" steps to enter and pt is unsure if there is a hand rail.  pt indicates he only has crutches.    Lives With: Family  Functional History: Prior Function Level of Independence: Independent Functional Status:  Mobility: Bed Mobility Overal bed mobility: Needs Assistance Bed Mobility: Supine to Sit Supine to sit: Min assist General bed  mobility comments: A with L LE only.   Transfers Overall transfer level: Needs assistance Equipment used: 1 person hand held assist Transfers: Sit to/from Stand, Stand Pivot Transfers Sit to Stand: Min assist Stand pivot transfers: Min assist General transfer comment: pt  does tend to lean to R side and avoids attempting to WB on L LE.  pt able to take steps with L LE towards recliner.        ADL:    Cognition: Cognition Overall Cognitive Status: Impaired/Different from baseline Arousal/Alertness: Lethargic Orientation Level: Oriented X4 Attention: Sustained Sustained Attention: Appears intact Memory: Impaired Memory Impairment: Decreased recall of new information Awareness: Impaired Awareness Impairment: Emergent impairment Problem Solving: Impaired Problem Solving Impairment: Functional complex Executive Function: Self Monitoring, Self Correcting Self Monitoring: Impaired Self Monitoring Impairment: Functional complex Self Correcting: Impaired Self Correcting Impairment: Functional complex Safety/Judgment: Appears intact Cognition Arousal/Alertness: Awake/alert Behavior During Therapy: WFL for tasks assessed/performed Overall Cognitive Status: Impaired/Different from baseline  Blood pressure 162/93, pulse 58, temperature 97.6 F (36.4 C), temperature source Oral, resp. rate 21, weight 93.3 kg (205 lb 11 oz), SpO2 94 %. Physical Exam  HENT:  Head: Normocephalic.  Eyes: EOM are normal.  Neck: Normal range of motion. Neck supple. No thyromegaly present.  Cardiovascular: Normal rate and regular rhythm.   Respiratory: Effort normal and breath sounds normal. No respiratory distress.  GI: Soft. Bowel sounds are normal. He exhibits no distension.  Neurological: He is alert.  Matrix good eye contact with examiner. Follows commands. Fair awareness of deficits. Oriented to person place and time. cogwheeling weakness of left arm and leg. Hoover's test +. Senses pain in both left arm and leg. Has pain in left shoulder and knee which inhibit movement  Skin: Skin is warm and dry.  Psychiatric: He has a normal mood and affect. His behavior is normal. Thought content normal.    No results found for this or any previous visit (from the past 24  hour(s)). Mr Brain Wo Contrast  02/25/2015  CLINICAL DATA:  New onset of left-sided numbness, weakness, and slurred speech. Last seen well at 6 p.m. yesterday. Abnormal CT scan. EXAM: MRI HEAD WITHOUT CONTRAST TECHNIQUE: Multiplanar, multiecho pulse sequences of the brain and surrounding structures were obtained without intravenous contrast. COMPARISON:  CT head without contrast 02/24/2015. FINDINGS: The diffusion-weighted images demonstrate no acute or subacute infarct. Findings on yesterday's study were likely artifactual. No significant white matter disease is present. The ventricles are of normal size. No significant extra-axial fluid collection is present. The internal auditory canals are within normal limits bilaterally. Flow is present in the major intracranial arteries. The globes and orbits are intact. Mild mucosal thickening is present in the maxillary sinuses bilaterally. The remaining paranasal sinuses are clear. The mastoid air cells are clear. The skullbase is within normal limits. 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 Midline sagittal images are otherwise normal. IMPRESSION: 1. Normal MRI appearance the brain. No evidence for acute or subacute infarction. 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 Electronically Signed   By: San Morelle M.D.   On: 02/25/2015 18:38    Assessment/Plan: Diagnosis: left sided weakness, appears to be non-organic or at least non-neurological in nature. 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 Does the need for close, 24 hr/day medical supervision in concert with the patient's rehab needs make it unreasonable  for this patient to be served in a less intensive setting? No 2. Co-Morbidities requiring supervision/potential complications:   3. Due to bladder management, bowel management, safety, skin/wound care and  medication administration, does the patient require 24 hr/day rehab nursing? No 4. Does the patient require coordinated care of a physician, rehab nurse, PT (1-2 hrs/day, 5 days/week) and OT (1-2 hrs/day, 5 days/week) to address physical and functional deficits in the context of the above medical diagnosis(es)? No Addressing deficits in the following areas: balance, endurance, locomotion, strength, transferring, bowel/bladder control, bathing, dressing, feeding, grooming, toileting and psychosocial support 5. Can the patient actively participate in an intensive therapy program of at least 3 hrs of therapy per day at least 5 days per week? Potentially 6. The potential for patient to make measurable gains while on inpatient rehab is n/a 7. Anticipated functional outcomes upon discharge from inpatient rehab are n/a  with PT, n/a with OT, n/a with SLP. 8. Estimated rehab length of stay to reach the above functional goals is: n/a 9. Does the patient have adequate social supports and living environment to accommodate these discharge functional goals? N/A 10. Anticipated D/C setting: Home 11. Anticipated post D/C treatments: N/A 12. Overall Rehab/Functional Prognosis: good  RECOMMENDATIONS: This patient's condition is appropriate for continued rehabilitative care in the following setting: [facility] Therapy Patient has agreed to participate in recommended program. Yes Note that insurance prior authorization may be required for reimbursement for recommended care.  Comment:   Meredith Staggers, MD, [place] [facility] 02/27/2015     02/27/2015

## 2015-02-27 NOTE — Progress Notes (Signed)
STROKE TEAM PROGRESS NOTE   HISTORY Richard Soto is an 49 y.o. male with a history of hypertension, pulmonary embolus and hyperlipidemia, brought to the emergency room following onset of weakness and numbness involving his left side as well as slurred speech. He was last known well at 6 PM on 02/24/2015. He has no previous history of stroke nor TIA. Anticoagulation was discontinued one month prior to admission. He was not on antiplatelet therapy. CT scan of his head showed increased density of distal right M1 MCA. There was also early signs of possible ischemic changes in right MCA territory. NIH stroke score was 15. A pressure was elevated requiring intervention with intravenous labetalol. Patient was beyond time window for treatment consideration with IV TPA. Interventional radiology was consulted and patient underwent catheter cerebral angiogram. Study showed no occlusion or severe stenosis involving right ICA nor MCA. Patient was transferred to neurology intensive care unit for further management.   SUBJECTIVE (INTERVAL HISTORY) His RN is at the bedside.  She reports that the patient has been doing better and was able to ambulate with help to the restroom. He is complaining of right knee pain which is a chronic problem.  OBJECTIVE Temp:  [97.4 F (36.3 C)-98.3 F (36.8 C)] 97.6 F (36.4 C) (12/16 0747) Pulse Rate:  [54-66] 58 (12/16 0800) Cardiac Rhythm:  [-] Normal sinus rhythm;Sinus bradycardia (12/16 0800) Resp:  [10-21] 21 (12/16 0800) BP: (141-181)/(82-94) 162/93 mmHg (12/16 0800) SpO2:  [94 %-100 %] 94 % (12/16 0800)  CBC:   Recent Labs Lab 02/24/15 2233 02/25/15 0414  WBC 10.1 11.0*  NEUTROABS 5.5  --   HGB 17.2* 13.9  HCT 50.2 41.9  MCV 95.8 97.9  PLT 281 [id number]    Basic Metabolic Panel:   Recent Labs Lab 02/24/15 2233 02/25/15 0414  NA 141 141  K 4.0 4.3  CL 108 111  CO2 19* 17*  GLUCOSE 98 101*  BUN <5* 6  CREATININE 0.75 0.87  CALCIUM 9.9 8.6*  MG  --  1.8   PHOS  --  4.2    Lipid Panel:     Component Value Date/Time   CHOL 133 02/25/2015 1133   TRIG 67 02/25/2015 1133   HDL 37* 02/25/2015 1133   CHOLHDL 3.6 02/25/2015 1133   VLDL 13 02/25/2015 1133   LDLCALC 83 02/25/2015 1133   HgbA1c:  Lab Results  Component Value Date   HGBA1C 5.8* 02/25/2015   Urine Drug Screen:     Component Value Date/Time   LABOPIA POSITIVE* 02/25/2015 1643   COCAINSCRNUR NONE DETECTED 02/25/2015 1643   LABBENZ POSITIVE* 02/25/2015 1643   AMPHETMU NONE DETECTED 02/25/2015 1643   THCU NONE DETECTED 02/25/2015 1643   LABBARB NONE DETECTED 02/25/2015 1643      IMAGING  Ct Head Wo Contrast 02/24/2015   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 No hemorrhagic conversion.   Ct Angio Chest Pe W/cm &/or Wo Cm 02/25/2015  1. No evidence of significant pulmonary embolus. 2. Minimal bilateral atelectasis noted.  Lungs otherwise clear. 3. Scattered coronary artery calcifications seen.   Dg Chest Port 1 View 02/25/2015  Mild peribronchial thickening noted.  Lungs otherwise clear.    MRI of Head 02/25/2015 1. Normal MRI appearance the brain. No evidence for acute or subacute infarction. 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       PHYSICAL EXAM Obese middle-aged [place] middle aged african [place] male not in distress. Marland Kitchen  Afebrile. Head is nontraumatic. Neck is supple without bruit.    Cardiac exam no murmur or gallop. Lungs are clear to auscultation. Distal pulses are well felt. Neurological Exam :   Awake alert oriented 3. Mild dysarthria. No aphasia. Follows commands well. Extraocular movements are full range  no field cut. Pupils equal reactive. Fundi were not visualized. Vision acuity seems adequate. No facial weakness. Tongue midline. Mild hemiplegia with grade 4/5 strength in the left upper extremity with significant weakness of the grip and distal hand muscles 3/5  left lower extremity weakness with trace withdrawal to pain extremely poor and variable effort while testing the left side.  . Subjective diminished touch pinprick and vibration sensation on the left hemibody including forehead with splitting of the midline for touch as well as vibration sensation over the forehead. Left plantar upgoing right downgoing.  Deep tendon reflexes 2+ on the right and absent on the left. Gait was not tested.  ASSESSMENT/PLAN [name] is a 49 y.o. male with history of hypertension, pulmonary embolus and hyperlipidemia presenting with left sided weakness and numbness and slurred speech. He did not receive IV t-PA due to beyond treatment window; he had no large vessel occlusion on catheter angiogram.    Stroke like symptoms possibly nonorganic etiology with negative MRI scan   Resultant  Left hemiparesis, dysarthria with nonorganic features  Catheter angiogram no large vessel occlusion of right ICA MCA (intubated for procedure)  MRI  negative for infarct.  2D Echo EF 60-65%. No cardiac source of emboli identified.  LDL 83  HgbA1c 5.8  SCDs for VTE prophylaxis. Change to lovenox DIET DYS 3 Room service appropriate?: Yes; Fluid consistency:: Thin  No antithrombotic prior to admission,Start  aspirin 325 mg daily    Ongoing aggressive stroke risk factor management  Therapy recommendations:  Home health physical therapy recommended.  Ok to transfer to floor once a-line out  Disposition:  pending   Hypertension  Stable  Permissive hypertension (OK if < 220/120) but gradually normalize in 5-7 days  Hyperlipidemia  Home meds:  OMEGA-3 FATTY ACIDS  LDL 83 , goal < 70  Start low-dose Lipitor  Continue statin at time of discharge.  Other Stroke Risk Factors  Cigarette smoker, advised to stop smoking  ETOH use per EMR - pt denies  UDS - positive for opiates and benzodiazepines.  Other Active Problems  01/2015 - [facility] History of pulmonary  embolism suspected to have come from his RLE, with anticoagulation stopped due to GIB. LE doppler neg at that time. Colonoscopy planned. - notes they were unable to contact him for follow up.     Paradoxical response to fentanyl yesterday. Once able to swallow, will try tramadol vs tylenol #3  Urinary retention - Flomax started 3 days.  Hospital day # 3      I have personally examined this patient, reviewed notes, independently viewed imaging studies, participated in medical decision making and plan of care. I have made any additions or clarifications directly to the above note. Agree with note above.  He has shown improvement in her left hemiparesis. Plan transfer to the neurology stroke unit to outside ICU and  mobilize out of bed and rehabilitation consult.. No family at bedside  Antony Contras, MD  To contact Stroke Continuity provider, please refer to [email or web address] After hours, contact General Neurology

## 2015-02-27 NOTE — Progress Notes (Signed)
Inpatient Rehabilitation  Per Dr. Naaman Plummer' consult, pt does not have necessity for IP Rehab.  I updated Lorne Skeens , RNCM of recommendation for [facility] services.  Alos note PT now recommending HHPT due to pt. Progress.  Will sign off.    Gerlean Ren PT Inpatient Rehab Admissions Coordinator Cell [phone] Office [phone]

## 2015-02-27 NOTE — Progress Notes (Signed)
Occupational Therapy Evaluation Patient Details Name: Richard Soto MRN: [id number] DOB: 1966/01/09 Today's Date: 02/27/2015    History of Present Illness pt presents with L sided weakness and negative MRI.  pt positive for Etoh, Benzos, and Opiates on admit.  pt with hx of HTN and PE.     Clinical Impression   PTA, pt mod I with mobility @ crutch level and was independent with ADL. Pt presents with decreased functional use of LUE, in addition to decreased independence with ADL and functional mobility for ADL. Pt benefit from skilled OT services to maximize functional level of independence to facilitate a  Safe D/C home with intermittent S. Inconsistent use of LUE during assessment. Will follow acutely to establish HEP for LUE and educate on use of AE to increase independence with LB ADL is needed.    Follow Up Recommendations  No OT follow up;Supervision - Intermittent    Equipment Recommendations  Tub/shower bench;3 in 1 bedside comode    Recommendations for Other Services       Precautions / Restrictions Precautions Precautions: Fall Restrictions Weight Bearing Restrictions: No      Mobility Bed Mobility Overal bed mobility: Needs Assistance Bed Mobility: Supine to Sit     Supine to sit: Min assist     General bed mobility comments: OOB in chair  Transfers Overall transfer level: Needs assistance Equipment used: 1 person hand held assist Transfers: Sit to/from Stand Sit to Stand: Min guard Stand pivot transfers: Min guard       General transfer comment: pt does tend to lean to R side and avoids attempting to WB on L LE.  pt able to take steps with L LE towards recliner.      Balance Overall balance assessment: Needs assistance Sitting-balance support: No upper extremity supported;Feet supported Sitting balance-Leahy Scale: Good     Standing balance support: During functional activity;No upper extremity supported Standing balance-Leahy Scale: Fair                               ADL Overall ADL's : Needs assistance/impaired         Upper Body Bathing: Minimal assitance   Lower Body Bathing: Minimal assistance   Upper Body Dressing : Minimal assistance   Lower Body Dressing: Minimal assistance   Toilet Transfer: Min guard   Toileting- Clothing Manipulation and Hygiene: Minimal assistance       Functional mobility during ADLs: Min guard General ADL Comments: difficulty with LB ADL at baseline. Will benefit from AE     Vision Vision Assessment?: No apparent visual deficits Additional Comments: States vision initially blurred but now WNL   Perception     Bird-in-Hand tested?: Within functional limits    Pertinent Vitals/Pain Pain Assessment: 0-10 Pain Score: 5  Pain Location: L shoulder and R knee Pain Descriptors / Indicators: Aching Pain Intervention(s): Limited activity within patient's tolerance     Hand Dominance Right   Extremity/Trunk Assessment Upper Extremity Assessment Upper Extremity Assessment: LUE deficits/detail LUE Deficits / Details: inconsistent movement LUE. minimal movement on command but observed pt picking up call bell with LUE. reports LUE feels light.On command pt abler to demonstrate @ 20 degrees FF. 20 degrees elbow flexion. holds wrist at neutral.  LUE Coordination: decreased fine motor;decreased gross motor   Lower Extremity Assessment Lower Extremity Assessment: Defer to PT evaluation LLE Deficits / Details:  (R knee swollen. baseline) LLE Sensation: decreased light  touch LLE Coordination: decreased fine motor;decreased gross motor   Cervical / Trunk Assessment Cervical / Trunk Assessment: Normal   Communication Communication Communication: No difficulties   Cognition Arousal/Alertness: Awake/alert Behavior During Therapy: WFL for tasks assessed/performed Overall Cognitive Status: Within Functional Limits for tasks assessed                     General  Comments       Exercises Exercises: Other exercises Other Exercises Other Exercises: encouraged pt to use LUE functionally   Shoulder Instructions      Home Living Family/patient expects to be discharged to:: Private residence Living Arrangements: Other relatives Available Help at Discharge: Family;Available 24 hours/day Type of Home: House Home Access: Stairs to enter [employer] of Steps: 3 Entrance Stairs-Rails: Right (thinks so) Home Layout: One level     Bathroom Shower/Tub: Tub/shower unit Shower/tub characteristics: Architectural technologist: Standard     Home Equipment: Crutches   Additional Comments: pt lives in [place], [place] and is staying with his brother and sister-in-law locally.  Brother's home is one level with a "few" steps to enter and pt is unsure if there is a hand rail.  pt indicates he only has crutches.    Lives With: Family    Prior Functioning/Environment Level of Independence: Independent with assistive device(s)             OT Diagnosis: Generalized weakness   OT Problem List: Decreased strength;Decreased range of motion;Decreased activity tolerance;Decreased knowledge of use of DME or AE;Impaired sensation;Impaired UE functional use;Pain   OT Treatment/Interventions: Self-care/ADL training;Therapeutic exercise;Neuromuscular education;DME and/or AE instruction;Therapeutic activities;Patient/family education;Balance training    OT Goals(Current goals can be found in the care plan section) Acute Rehab OT Goals Patient Stated Goal: get better OT Goal Formulation: With patient Time For Goal Achievement: 03/13/15 Potential to Achieve Goals: Good ADL Goals Pt Will Perform Lower Body Bathing: with set-up;with supervision;with adaptive equipment;with caregiver independent in assisting;sit to/from stand Pt Will Perform Lower Body Dressing: with set-up;with supervision;with adaptive equipment;with caregiver independent in assisting;sit to/from  stand Pt Will Transfer to Toilet: with modified independence;ambulating;bedside commode Pt Will Perform Tub/Shower Transfer: with supervision;tub bench;rolling walker;ambulating Pt/caregiver will Perform Home Exercise Program: Increased ROM;Increased strength;Left upper extremity;With theraputty;With written HEP provided;Independently  OT Frequency: Min 3X/week   Barriers to D/C:            Co-evaluation              End of Session Nurse Communication: Mobility status  Activity Tolerance: Patient tolerated treatment well Patient left: in chair;with call bell/phone within reach;with chair alarm set   Time: 1610-1630 OT Time Calculation (min): 20 min Charges:  OT General Charges $OT Visit: 1 Procedure OT Evaluation $Initial OT Evaluation Tier I: 1 Procedure G-Codes:    Julitza Rickles,HILLARY 2015-03-22, 4:41 PM   Maurie Boettcher, OTR/L  [phone] 22-Mar-2015

## 2015-02-27 NOTE — Progress Notes (Signed)
Physical Therapy Treatment Patient Details Name: Richard Soto MRN: [id number] DOB: 03/26/1965 Today's Date: 02/27/2015    History of Present Illness pt presents with L sided weakness and negative MRI.  pt positive for Etoh, Benzos, and Opiates on admit.  pt with hx of HTN and PE.      PT Comments    Pt with improved mobility today and able to ambulate out into hallway.  Pt favoring R knee more than L LE at this time and holds L LE very rigid during ambulation.  Feel pt will continue to improve mobility each day to be able to D/C to home.    Follow Up Recommendations  Home health PT;Supervision/Assistance - 24 hour     Equipment Recommendations  Rolling walker with 5" wheels    Recommendations for Other Services       Precautions / Restrictions Precautions Precautions: Fall Restrictions Weight Bearing Restrictions: No    Mobility  Bed Mobility               General bed mobility comments: pt sitting in chair.  Transfers Overall transfer level: Needs assistance Equipment used: None Transfers: Sit to/from Stand Sit to Stand: Min guard         General transfer comment: Definite use of R UE with coming to stand.    Ambulation/Gait Ambulation/Gait assistance: Min guard Ambulation Distance (Feet): 80 Feet Assistive device: None Gait Pattern/deviations: Step-through pattern;Decreased stride length;Antalgic     General Gait Details: pt antalgic and favoring R knee during ambulation more than L LE.  pt bearing weight on L LE and holding very rigidly during ambulation.     Stairs            Wheelchair Mobility    Modified Rankin (Stroke Patients Only) Modified Rankin (Stroke Patients Only) Pre-Morbid Rankin Score: No symptoms Modified Rankin: Severe disability     Balance Overall balance assessment: Needs assistance Sitting-balance support: No upper extremity supported;Feet supported Sitting balance-Leahy Scale: Good     Standing balance support:  During functional activity;No upper extremity supported Standing balance-Leahy Scale: Fair                      Cognition Arousal/Alertness: Awake/alert Behavior During Therapy: WFL for tasks assessed/performed Overall Cognitive Status: Within Functional Limits for tasks assessed                      Exercises      General Comments        Pertinent Vitals/Pain Pain Assessment: No/denies pain    Home Living                      Prior Function            PT Goals (current goals can now be found in the care plan section) Acute Rehab PT Goals Patient Stated Goal: Get better. PT Goal Formulation: With patient Time For Goal Achievement: 03/12/15 Potential to Achieve Goals: Good Progress towards PT goals: Progressing toward goals    Frequency  Min 4X/week    PT Plan Current plan remains appropriate    Co-evaluation             End of Session Equipment Utilized During Treatment: Gait belt Activity Tolerance: Patient tolerated treatment well Patient left: in chair;with call bell/phone within reach     Time: 1332-1400 PT Time Calculation (min) (ACUTE ONLY): 28 min  Charges:  $Gait Training: 23-37 mins  G CodesCatarina Hartshorn, [place] 02/27/2015, 2:52 PM

## 2015-02-27 NOTE — Progress Notes (Signed)
Patient came up from 75M around 1500 alert and oriented able to ambulate minimally from wheel chair to chair at bedside he is alert and oriented will continue to monitor.

## 2015-02-28 LAB — ANTITHROMBIN III: AntiThromb III Func: 112 % (ref 75–120)

## 2015-02-28 MED ORDER — TAMSULOSIN HCL 0.4 MG PO CAPS
0.4000 mg | ORAL_CAPSULE | Freq: Every day | ORAL | Status: DC
  Administered 2015-03-01 – 2015-03-02 (×2): 0.4 mg via ORAL
  Filled 2015-02-28 (×2): qty 1

## 2015-02-28 NOTE — Progress Notes (Signed)
STROKE TEAM PROGRESS NOTE   HISTORY Richard Soto is an 49 y.o. male with a history of hypertension, pulmonary embolus and hyperlipidemia, brought to the emergency room following onset of weakness and numbness involving his left side as well as slurred speech. He was last known well at 6 PM on 02/24/2015. He has no previous history of stroke nor TIA. Anticoagulation was discontinued one month prior to admission. He was not on antiplatelet therapy. CT scan of his head showed increased density of distal right M1 MCA. There was also early signs of possible ischemic changes in right MCA territory. NIH stroke score was 15. A pressure was elevated requiring intervention with intravenous labetalol. Patient was beyond time window for treatment consideration with IV TPA. Interventional radiology was consulted and patient underwent catheter cerebral angiogram. Study showed no occlusion or severe stenosis involving right ICA nor MCA. Patient was transferred to neurology intensive care unit for further management.   SUBJECTIVE (INTERVAL HISTORY) No family members present. The patient reports he is feeling better but still weak on his left side.  OBJECTIVE Temp:  [98.1 F (36.7 C)-98.7 F (37.1 C)] 98.7 F (37.1 C) (12/17 1353) Pulse Rate:  [62-74] 67 (12/17 1353) Cardiac Rhythm:  [-] Normal sinus rhythm (12/17 0701) Resp:  [18-20] 18 (12/17 1353) BP: (111-146)/(56-95) 111/56 mmHg (12/17 1353) SpO2:  [96 %-100 %] 96 % (12/17 1353) Weight:  [93.3 kg (205 lb 11 oz)] 93.3 kg (205 lb 11 oz) (12/16 1726)  CBC:   Recent Labs Lab 02/24/15 2233 02/25/15 0414  WBC 10.1 11.0*  NEUTROABS 5.5  --   HGB 17.2* 13.9  HCT 50.2 41.9  MCV 95.8 97.9  PLT 281 [id number]    Basic Metabolic Panel:   Recent Labs Lab 02/24/15 2233 02/25/15 0414  NA 141 141  K 4.0 4.3  CL 108 111  CO2 19* 17*  GLUCOSE 98 101*  BUN <5* 6  CREATININE 0.75 0.87  CALCIUM 9.9 8.6*  MG  --  1.8  PHOS  --  4.2    Lipid Panel:      Component Value Date/Time   CHOL 133 02/25/2015 1133   TRIG 67 02/25/2015 1133   HDL 37* 02/25/2015 1133   CHOLHDL 3.6 02/25/2015 1133   VLDL 13 02/25/2015 1133   LDLCALC 83 02/25/2015 1133   HgbA1c:  Lab Results  Component Value Date   HGBA1C 5.8* 02/25/2015   Urine Drug Screen:     Component Value Date/Time   LABOPIA POSITIVE* 02/25/2015 1643   COCAINSCRNUR NONE DETECTED 02/25/2015 1643   LABBENZ POSITIVE* 02/25/2015 1643   AMPHETMU NONE DETECTED 02/25/2015 1643   THCU NONE DETECTED 02/25/2015 1643   LABBARB NONE DETECTED 02/25/2015 1643      IMAGING  Ct Head Wo Contrast 02/24/2015   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 No hemorrhagic conversion.   Ct Angio Chest Pe W/cm &/or Wo Cm 02/25/2015  1. No evidence of significant pulmonary embolus. 2. Minimal bilateral atelectasis noted.  Lungs otherwise clear. 3. Scattered coronary artery calcifications seen.   Dg Chest Port 1 View 02/25/2015  Mild peribronchial thickening noted.  Lungs otherwise clear.    MRI of Head 02/25/2015 1. Normal MRI appearance the brain. No evidence for acute or subacute infarction. 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     PHYSICAL EXAM Obese middle-aged [place] middle aged african [place] male not in distress. . Afebrile. Head is nontraumatic. Neck is  supple without bruit.    Cardiac exam no murmur or gallop. Lungs are clear to auscultation. Distal pulses are well felt. Neurological Exam :   Awake alert oriented 3. Mild dysarthria. No aphasia. Follows commands well. Extraocular movements are full range  no field cut. Pupils equal reactive. Fundi were not visualized. Vision acuity seems adequate. No facial weakness. Tongue midline. Mild hemiplegia with grade 4/5 strength in the left upper extremity with significant weakness of the grip and distal hand muscles 3/5 left lower extremity weakness with trace  withdrawal to pain extremely poor and variable effort while testing the left side.  . Subjective diminished touch pinprick and vibration sensation on the left hemibody including forehead with splitting of the midline for touch as well as vibration sensation over the forehead. Left plantar upgoing right downgoing.  Deep tendon reflexes 2+ on the right and absent on the left. Gait was not tested.     ASSESSMENT/PLAN  Mr. Durron Heagney is a 49 y.o. male with history of hypertension, pulmonary embolus and hyperlipidemia presenting with left sided weakness and numbness with slurred speech. He did not receive IV t-PA due to beyond treatment window; he had no large vessel occlusion on catheter angiogram.   Stroke like symptoms possibly nonorganic etiology with negative MRI scan   Resultant  Left hemiparesis, dysarthria with nonorganic features  Catheter angiogram no large vessel occlusion of right ICA MCA (intubated for procedure)  MRI  negative for infarct.  2D Echo EF 60-65%. No cardiac source of emboli identified.  LDL 83  HgbA1c 5.8  VTE prophylaxis - now on Lovenox. SCDs discontinued. DIET DYS 3 Room service appropriate?: Yes; Fluid consistency:: Thin  No antithrombotic prior to admission,Start  aspirin 325 mg daily    Ongoing aggressive stroke risk factor management  Therapy recommendations:  Home health physical therapy recommended.  Disposition:  pending   Hypertension  Stable  Permissive hypertension (OK if < 220/120) but gradually normalize in 5-7 days  Hyperlipidemia  Home meds:  OMEGA-3 FATTY ACIDS  LDL 83 , goal < 70  Start low-dose Lipitor 20 mg daily  Continue statin at time of discharge.  Other Stroke Risk Factors  Cigarette smoker, advised to stop smoking  ETOH use per EMR - pt denies  UDS - positive for opiates and benzodiazepines. Substance abuse history from Epic EMR.    Other Active Problems  [facility] History of pulmonary embolism suspected to have  come from his RLE. Xarelto stopped due to GIB.  Colonoscopy planned. - they were unable to contact him for follow up.  LE doppler neg on 01/14/2015.      Paradoxical response to fentanyl 02/26/2015. Now on tramadol and Tylenol # 3 when necessary.  Urinary retention - Flomax started   Discussed with Dr. Irish Elders. Order hypercoagulable panel.  Consider TEE.   Hospital day # [place] PA-C [facility] Pager [phone] 02/28/2015, 2:30 PM I have personally examined this patient, reviewed notes, independently viewed imaging studies, participated in medical decision making and plan of care. I have made any additions or clarifications directly to the above note. Agree with note above.   This pt is likely hypercoagulable. Do agree with repeating hypercoagulable panel.   If sinus rhythm wood consider Linq implantation to look for arhythmia as well as TEE as pt will likely need anticoagulation in future.  Leotis Pain    To contact Stroke Continuity provider, please refer to [email or web address] After hours, contact General Neurology

## 2015-02-28 NOTE — Progress Notes (Signed)
Physical Therapy Treatment Patient Details Name: Richard Soto MRN: [id number] DOB: January 01, 1966 Today's Date: 02/28/2015    History of Present Illness pt presents with L sided weakness and negative MRI.  pt positive for Etoh, Benzos, and Opiates on admit.  pt with hx of HTN and PE.      PT Comments    Mr. Ekblad shuffles Lt foot along floor while ambulating in hallway. Initially furniture surfing in room but able to ambulate 60 ft w/o AD. Pt c/o fear of Rt knee buckle (h/o) which limits his distance ambulating this session.  Encouraged pt to continue using Lt UE/LE functionally.   Follow Up Recommendations  Home health PT;Supervision/Assistance - 24 hour     Equipment Recommendations  Rolling walker with 5" wheels    Recommendations for Other Services       Precautions / Restrictions Precautions Precautions: Fall Restrictions Weight Bearing Restrictions: No    Mobility  Bed Mobility Overal bed mobility: Needs Assistance Bed Mobility: Supine to Sit;Sit to Supine     Supine to sit: Min guard Sit to supine: Min guard   General bed mobility comments: Pt performs valsalva maneuver while transitioning to long sitting.  Cues for proper technique and proper breathing techniques.  Pt w/ proper technique returning to supine after ambulation.  Transfers Overall transfer level: Needs assistance Equipment used: None Transfers: Sit to/from Stand Sit to Stand: Min guard         General transfer comment: Uses momentum to stand w/ instability noted as pt holds onto rail.  Cues to back up all the way to bed prior to attempting stand>sit.  Ambulation/Gait Ambulation/Gait assistance: Min guard Ambulation Distance (Feet): 60 Feet Assistive device: None Gait Pattern/deviations: Step-through pattern;Shuffle;Antalgic;Decreased stride length   Gait velocity interpretation: <1.8 ft/sec, indicative of risk for recurrent falls General Gait Details: pt antalgic and favoring Rt knee  during ambulation w/ c/o Rt knee feeling as though it will buckle (no buckle noted).  Pt initially furniture surfing in room but quickly improves and is able to ambulate w/o AD.  Pt shuffles Lt foot.   Stairs            Wheelchair Mobility    Modified Rankin (Stroke Patients Only) Modified Rankin (Stroke Patients Only) Pre-Morbid Rankin Score: No symptoms Modified Rankin: Severe disability     Balance Overall balance assessment: Needs assistance Sitting-balance support: Feet supported Sitting balance-Leahy Scale: Good     Standing balance support: During functional activity Standing balance-Leahy Scale: Fair                      Cognition Arousal/Alertness: Awake/alert Behavior During Therapy: WFL for tasks assessed/performed Overall Cognitive Status: Within Functional Limits for tasks assessed                      Exercises General Exercises - Lower Extremity Long Arc Quad: AAROM;Left;5 reps;Seated (eccentric) Other Exercises Other Exercises: encouraged pt to use LUE functionally    General Comments General comments (skin integrity, edema, etc.): Of note, at start of session pt demonstrates that he cannot lift his Lt UE away from the bed sitting EOB.  However, after ambulating to bathroom pt is able to reach up w/ Lt UE and flush toilet.      Pertinent Vitals/Pain Pain Assessment: 0-10 Pain Score: 4  Pain Location: Lt shoulder and Rt knee Pain Descriptors / Indicators: Aching Pain Intervention(s): Limited activity within patient's tolerance;Monitored during session    Home  Living                      Prior Function            PT Goals (current goals can now be found in the care plan section) Acute Rehab PT Goals Patient Stated Goal: To be able to move my Lt hand PT Goal Formulation: With patient Time For Goal Achievement: 03/12/15 Potential to Achieve Goals: Good Progress towards PT goals: Progressing toward goals     Frequency  Min 4X/week    PT Plan Current plan remains appropriate    Co-evaluation             End of Session Equipment Utilized During Treatment: Gait belt Activity Tolerance: Patient tolerated treatment well Patient left: with call bell/phone within reach;in bed;Other (comment) (w/ staff from Lab in room)     Time: [id number] PT Time Calculation (min) (ACUTE ONLY): 15 min  Charges:  $Gait Training: 8-22 mins                    G Codes:      Joslyn Hy PT, [place] [phone] Pager: [phone] 02/28/2015, 3:28 PM

## 2015-03-01 ENCOUNTER — Inpatient Hospital Stay (HOSPITAL_COMMUNITY): Payer: Medicaid Other

## 2015-03-01 LAB — CBC
HEMATOCRIT: 42.6 % (ref 39.0–52.0)
HEMOGLOBIN: 14.3 g/dL (ref 13.0–17.0)
MCH: 32.6 pg (ref 26.0–34.0)
MCHC: 33.6 g/dL (ref 30.0–36.0)
MCV: 97 fL (ref 78.0–100.0)
Platelets: 216 10*3/uL (ref 150–400)
RBC: 4.39 MIL/uL (ref 4.22–5.81)
RDW: 12.1 % (ref 11.5–15.5)
WBC: 6.9 10*3/uL (ref 4.0–10.5)

## 2015-03-01 LAB — BASIC METABOLIC PANEL
ANION GAP: 9 (ref 5–15)
BUN: 11 mg/dL (ref 6–20)
CHLORIDE: 103 mmol/L (ref 101–111)
CO2: 25 mmol/L (ref 22–32)
Calcium: 9.9 mg/dL (ref 8.9–10.3)
Creatinine, Ser: 0.65 mg/dL (ref 0.61–1.24)
GFR calc Af Amer: >60 mL/min (ref >60)
GLUCOSE: 99 mg/dL (ref 65–99)
POTASSIUM: 4.2 mmol/L (ref 3.5–5.1)
Sodium: 137 mmol/L (ref 135–145)

## 2015-03-01 LAB — HIV ANTIBODY (ROUTINE TESTING W REFLEX): HIV Screen 4th Generation wRfx: NONREACTIVE

## 2015-03-01 LAB — RPR: RPR Ser Ql: NONREACTIVE

## 2015-03-01 LAB — CARDIOLIPIN ANTIBODIES, IGG, IGM, IGA: Anticardiolipin IgA: 17 APL U/mL — ABNORMAL HIGH (ref 0–11)

## 2015-03-01 LAB — HOMOCYSTEINE: HOMOCYSTEINE-NORM: 13.7 umol/L (ref 0.0–15.0)

## 2015-03-01 MED ORDER — IOHEXOL 350 MG/ML SOLN
100.0000 mL | Freq: Once | INTRAVENOUS | Status: AC | PRN
  Administered 2015-03-01: 80 mL via INTRAVENOUS

## 2015-03-01 MED ORDER — LORAZEPAM 1 MG PO TABS
1.0000 mg | ORAL_TABLET | Freq: Four times a day (QID) | ORAL | Status: DC | PRN
  Administered 2015-03-02: 1 mg via ORAL
  Filled 2015-03-01: qty 1

## 2015-03-01 MED ORDER — LORAZEPAM BOLUS VIA INFUSION
0.5000 mg | INTRAVENOUS | Status: AC | PRN
  Administered 2015-03-01 (×2): 0.5 mg via INTRAVENOUS
  Filled 2015-03-01 (×3): qty 1

## 2015-03-01 MED ORDER — LORAZEPAM 2 MG/ML IJ SOLN
INTRAMUSCULAR | Status: AC
  Filled 2015-03-01: qty 1

## 2015-03-01 NOTE — Progress Notes (Signed)
Pt placed on 2l [place] due to pt stating he felt SOB.  Will continue to monitor.

## 2015-03-01 NOTE — Progress Notes (Signed)
At 1335 patient reported chest pain, described as heaviness and stated was short of breath. Vitals and O2 taken and documented , WNL. EKG completed. Notifed PA-C David Rinehuls. ABGs ordered and O2 at 2 l/[place]  initiated. Patient given 0.5 Ativan IV. Patient said that ativan provided some relief of symptoms.

## 2015-03-01 NOTE — Progress Notes (Signed)
Occupational Therapy Treatment Patient Details Name: Richard Soto MRN: [id number] DOB: 04/15/1965 Today's Date: 03/01/2015    History of present illness pt presents with L sided weakness and negative MRI.  pt positive for Etoh, Benzos, and Opiates on admit.  pt with hx of HTN and PE.     OT comments  Pt. Provided with theraputty and HEP in efforts for increasing strength and use of L hand/digits.  Pt. Requiring max encouragement for functional use of L hand.  Pt. Verbalizes understanding but expresses frustration with the process.  Next session will focus on use of A/E for LB ADLS with continued emphasis on L hand use.    Follow Up Recommendations  No OT follow up;Supervision - Intermittent    Equipment Recommendations  Tub/shower bench;3 in 1 bedside comode    Recommendations for Other Services      Precautions / Restrictions Precautions Precautions: Fall       Mobility Bed Mobility                  Transfers                      Balance                                   ADL                                                Vision                     Perception     Praxis      Cognition   Behavior During Therapy: WFL for tasks assessed/performed Overall Cognitive Status: Within Functional Limits for tasks assessed                       Extremity/Trunk Assessment               Exercises Other Exercises Other Exercises: encouraged pt to use LUE functionally, pt. verbalized understanding but continues to favor R UE for tasks ie: bed mobility only pulled with R UE, also max inst. and tactile guidance for use of theraputty Other Exercises: provided medium strength theraputty, with HEP for use.  reviewed and had pt. return demo of use, pt. only performing exercises with r hand, reviewed l hand and digits really needed the strengthening, he would say "okay, yeah" but continue to perform exercises  with r hand.  reivewed need to incorporate use of left hand into functional tasks, he verbalized understanding but says "it just gets really frustrating though".  reviewed if he does not try he will not regain use.     Shoulder Instructions       General Comments      Pertinent Vitals/ Pain       Pain Assessment: No/denies pain  Home Living                                          Prior Functioning/Environment              Frequency Min 3X/week     Progress Toward Goals  OT Goals(current goals can now be found in the care plan section)  Progress towards OT goals: Progressing toward goals     Plan Discharge plan remains appropriate    Co-evaluation                 End of Session     Activity Tolerance Patient tolerated treatment well   Patient Left in bed;with call bell/phone within reach   Nurse Communication          Time: 0750-0805 OT Time Calculation (min): 15 min  Charges: OT General Charges $OT Visit: 1 Procedure OT Treatments $Therapeutic Exercise: 8-22 mins  Richard Soto, COTA/L 03/01/2015, 8:11 AM

## 2015-03-01 NOTE — Progress Notes (Signed)
STROKE TEAM PROGRESS NOTE   HISTORY Richard Soto is an 49 y.o. male with a history of hypertension, pulmonary embolus and hyperlipidemia, brought to the emergency room following onset of weakness and numbness involving his left side as well as slurred speech. He was last known well at 6 PM on 02/24/2015. He has no previous history of stroke nor TIA. Anticoagulation was discontinued one month prior to admission. He was not on antiplatelet therapy. CT scan of his head showed increased density of distal right M1 MCA. There was also early signs of possible ischemic changes in right MCA territory. NIH stroke score was 15. A pressure was elevated requiring intervention with intravenous labetalol. Patient was beyond time window for treatment consideration with IV TPA. Interventional radiology was consulted and patient underwent catheter cerebral angiogram. Study showed no occlusion or severe stenosis involving right ICA nor MCA. Patient was transferred to neurology intensive care unit for further management.   SUBJECTIVE (INTERVAL HISTORY) No family members present. This AM the patient complained of knee and shoulder pain. The nurse asked me to reevaluate the patient this afternoon for complaints of midsternal chest pain and shortness of breath. The patient rates his pain as an 8 on a 1-10 scale. He does not appear to be in acute distress. Vital signs are stable and oxygen saturation is normal. We will check a stat EKG, portable chest x-ray, and ABGs on room air. After ABGs were drawn will start on oxygen 2 L/m. The patient does have a history of a previous pulmonary embolus. Would have a low threshold for CT angiogram of the chest. The patient also has a history of substance abuse. Question drug-seeking behavior.  OBJECTIVE Temp:  [97.7 F (36.5 C)-98.7 F (37.1 C)] 97.7 F (36.5 C) (12/18 0544) Pulse Rate:  [60-73] 62 (12/18 0544) Cardiac Rhythm:  [-] Normal sinus rhythm (12/18 0400) Resp:  [18-20] 18  (12/18 0544) BP: (104-143)/(56-78) 121/78 mmHg (12/18 0544) SpO2:  [96 %-99 %] 97 % (12/18 0544)  CBC:   Recent Labs Lab 02/24/15 2233 02/25/15 0414  WBC 10.1 11.0*  NEUTROABS 5.5  --   HGB 17.2* 13.9  HCT 50.2 41.9  MCV 95.8 97.9  PLT 281 [id number]    Basic Metabolic Panel:   Recent Labs Lab 02/24/15 2233 02/25/15 0414  NA 141 141  K 4.0 4.3  CL 108 111  CO2 19* 17*  GLUCOSE 98 101*  BUN <5* 6  CREATININE 0.75 0.87  CALCIUM 9.9 8.6*  MG  --  1.8  PHOS  --  4.2    Lipid Panel:     Component Value Date/Time   CHOL 133 02/25/2015 1133   TRIG 67 02/25/2015 1133   HDL 37* 02/25/2015 1133   CHOLHDL 3.6 02/25/2015 1133   VLDL 13 02/25/2015 1133   LDLCALC 83 02/25/2015 1133   HgbA1c:  Lab Results  Component Value Date   HGBA1C 5.8* 02/25/2015   Urine Drug Screen:     Component Value Date/Time   LABOPIA POSITIVE* 02/25/2015 1643   COCAINSCRNUR NONE DETECTED 02/25/2015 1643   LABBENZ POSITIVE* 02/25/2015 1643   AMPHETMU NONE DETECTED 02/25/2015 1643   THCU NONE DETECTED 02/25/2015 1643   LABBARB NONE DETECTED 02/25/2015 1643      IMAGING  Ct Head Wo Contrast 02/24/2015   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 No hemorrhagic conversion.   Ct Angio Chest Pe W/cm &/or Wo Cm 02/25/2015  1. No evidence of significant pulmonary  embolus. 2. Minimal bilateral atelectasis noted.  Lungs otherwise clear. 3. Scattered coronary artery calcifications seen.   Dg Chest Port 1 View 02/25/2015  Mild peribronchial thickening noted.  Lungs otherwise clear.    MRI of Head 02/25/2015 1. Normal MRI appearance the brain. No evidence for acute or subacute infarction. 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     PHYSICAL EXAM General - 49 year old male seated in a chair in no acute distress. Heart - Regular rate and rhythm - no murmer Lungs - Clear to auscultation Skin - Warm and  dry   Neurological Exam :   Awake alert oriented 3. Mild dysarthria. No aphasia. Follows commands well. Extraocular movements are full range  no field cut. Pupils equal reactive. Fundi were not visualized. Vision acuity seems adequate. No facial weakness. Tongue midline. Mild hemiplegia with grade 4/5 strength in the left upper extremity with significant weakness of the grip and distal hand muscles 3/5 left lower extremity weakness with trace withdrawal to pain extremely poor and variable effort while testing the left side.  . Subjective diminished touch pinprick and vibration sensation on the left hemibody including forehead with splitting of the midline for touch as well as vibration sensation over the forehead. Left plantar upgoing right downgoing.  Deep tendon reflexes 2+ on the right and absent on the left. Gait was not tested.     ASSESSMENT/PLAN  [name] is a 49 y.o. male with history of hypertension, pulmonary embolus and hyperlipidemia presenting with left sided weakness and numbness with slurred speech. He did not receive IV t-PA due to beyond treatment window; he had no large vessel occlusion on catheter angiogram.   Stroke like symptoms possibly nonorganic etiology with negative MRI scan   Resultant  Left hemiparesis, dysarthria with nonorganic features  Catheter angiogram no large vessel occlusion of right ICA MCA (intubated for procedure)  MRI  negative for infarct.  2D Echo EF 60-65%. No cardiac source of emboli identified.  LDL 83  HgbA1c 5.8  VTE prophylaxis - now on Lovenox. SCDs discontinued. DIET DYS 3 Room service appropriate?: Yes; Fluid consistency:: Thin  No antithrombotic prior to admission,Start  aspirin 325 mg daily    Ongoing aggressive stroke risk factor management  Therapy recommendations:  Home health physical therapy recommended.  Disposition:  pending   Hypertension  Stable  Permissive hypertension (OK if < 220/120) but gradually  normalize in 5-7 days  Hyperlipidemia  Home meds:  OMEGA-3 FATTY ACIDS  LDL 83 , goal < 70  Start low-dose Lipitor 20 mg daily  Continue statin at time of discharge.  Other Stroke Risk Factors  Cigarette smoker, advised to stop smoking  ETOH use per EMR - pt denies  UDS - positive for opiates and benzodiazepines. Substance abuse history from Epic EMR.    Other Active Problems  [facility] History of pulmonary embolism suspected to have come from his RLE. Xarelto stopped due to GIB.  Colonoscopy planned. - they were unable to contact him for follow up.  LE doppler neg on 01/14/2015.      Paradoxical response to fentanyl 02/26/2015. Now on tramadol and Tylenol # 3 when necessary.  Urinary retention - Flomax started   Discussed with Dr. Irish Elders. Order hypercoagulable panel - pending  TEE - per Dr. Irish Elders - schedule Monday.  Chest pain / shortness of breath - await labs.   Addendum -  The patient states that the chest pain is easing off; however, he still  feels there is something wrong. He is also complaining of a headache. He appears anxious.  EKG - without ischemic changes ABGs room air - pH 7.577  PCO2 22.8   PO2 127 the remainder normal. Consistent with hyperventilation. Portable chest x-ray - unremarkable  Discussed with Dr. Irish Elders - Will get CT angiogram of the chest rule out PE. Ativan for anxiety.   Hospital day # Jardine PA-C [facility] Pager [phone] 03/01/2015, 9:17 AM    I have personally examined this patient, reviewed notes, independently viewed imaging studies, participated in medical decision making and plan of care. I have made any additions or clarifications directly to the above note. Agree with note above.   This pt is likely hypercoagulable. Do agree with repeating hypercoagulable panel.   TEE early next week      To contact Stroke Continuity provider, please refer to [email or web address] After hours, contact  General Neurology

## 2015-03-01 NOTE — Progress Notes (Signed)
Chest CT negative for pulmonary embolus. Suspect anxiety is the etiology of the patient's symptoms. PRN ativan for tonight.  Mikey Bussing PA-C [facility] Pager [phone] 03/01/2015, 5:26 PM

## 2015-03-02 LAB — BLOOD GAS, ARTERIAL
Acid-base deficit: 0.6 mmol/L (ref 0.0–2.0)
BICARBONATE: 21.4 meq/L (ref 20.0–24.0)
DRAWN BY: 21338
FIO2: 0.21
O2 Saturation: 99.1 %
PATIENT TEMPERATURE: 98
PCO2 ART: 22.8 mmHg — AB (ref 35.0–45.0)
PH ART: 7.577 — AB (ref 7.350–7.450)
TCO2: 22.1 mmol/L (ref 0–100)
pO2, Arterial: 127 mmHg — ABNORMAL HIGH (ref 80.0–100.0)

## 2015-03-02 LAB — PROTEIN S, TOTAL: Protein S Ag, Total: 132 % (ref 60–150)

## 2015-03-02 LAB — PROTEIN S ACTIVITY: Protein S Activity: 129 % (ref 63–140)

## 2015-03-02 LAB — PROTEIN C, TOTAL: Protein C, Total: 121 % (ref 60–150)

## 2015-03-02 LAB — PROTEIN C ACTIVITY: Protein C Activity: 142 % (ref 73–180)

## 2015-03-02 MED ORDER — THIAMINE HCL 100 MG PO TABS: 100.0000 mg | ORAL_TABLET | Freq: Every day | ORAL | Status: DC

## 2015-03-02 MED ORDER — TRAMADOL HCL 50 MG PO TABS: 100.0000 mg | ORAL_TABLET | Freq: Four times a day (QID) | ORAL | Status: DC | PRN

## 2015-03-02 MED ORDER — AMLODIPINE BESYLATE 10 MG PO TABS: 10.0000 mg | ORAL_TABLET | Freq: Every day | ORAL | Status: AC

## 2015-03-02 MED ORDER — ATORVASTATIN CALCIUM 20 MG PO TABS: 20.0000 mg | ORAL_TABLET | Freq: Every day | ORAL | Status: AC

## 2015-03-02 MED ORDER — ACETAMINOPHEN 325 MG PO TABS: 650.0000 mg | ORAL_TABLET | Freq: Four times a day (QID) | ORAL | Status: DC | PRN

## 2015-03-02 MED ORDER — ACETAMINOPHEN-CODEINE #3 300-30 MG PO TABS: 1.0000 | ORAL_TABLET | Freq: Three times a day (TID) | ORAL | Status: DC | PRN

## 2015-03-02 MED ORDER — LORAZEPAM 1 MG PO TABS: 1.0000 mg | ORAL_TABLET | Freq: Three times a day (TID) | ORAL | Status: DC | PRN

## 2015-03-02 MED ORDER — FOLIC ACID 1 MG PO TABS: 1.0000 mg | ORAL_TABLET | Freq: Every day | ORAL | Status: DC

## 2015-03-02 MED ORDER — TAMSULOSIN HCL 0.4 MG PO CAPS: 0.4000 mg | ORAL_CAPSULE | Freq: Every day | ORAL | Status: AC

## 2015-03-02 MED ORDER — ASPIRIN 325 MG PO TABS: 325.0000 mg | ORAL_TABLET | Freq: Every day | ORAL | Status: DC

## 2015-03-02 NOTE — Progress Notes (Signed)
Occupational Therapy Treatment Patient Details Name: Richard Soto MRN: [id number] DOB: 01-Oct-1965 Today's Date: 03/02/2015    History of present illness pt presents with L sided weakness and negative MRI.  pt positive for Etoh, Benzos, and Opiates on admit. 12/18 with chest pain and CT angio chest negative.  pt with hx of HTN and PE.     OT comments  Pt progressing well towards goals. Pt with LOB x3 while ambulating in hallway due to R knee buckling but required no physical assist to regain balance. Discussed fall prevention and fall recovery strategies, provided and practiced with AE for LB ADLs, and tub transfer (pt has a swivel tub bench from father-in-law). Reviewed HEP for LUE gross and fine motor strength and ROM. Will continue to follow acutely to address all OT needs and goals.    Follow Up Recommendations  No OT follow up;Supervision - Intermittent    Equipment Recommendations  3 in 1 bedside comode    Recommendations for Other Services      Precautions / Restrictions Precautions Precautions: Fall Restrictions Weight Bearing Restrictions: No       Mobility Bed Mobility Overal bed mobility: Modified Independent Bed Mobility: Supine to Sit     Supine to sit: Modified independent (Device/Increase time)     General bed mobility comments: Pt up in chair on OT arrival  Transfers Overall transfer level: Needs assistance Equipment used: Crutches Transfers: Sit to/from Stand Sit to Stand: Min guard         General transfer comment: Min guard assist for R knee buckling. Uses 1 crutch with proper technique without cues.    Balance Overall balance assessment: Needs assistance Sitting-balance support: No upper extremity supported;Feet supported Sitting balance-Leahy Scale: Good     Standing balance support: Single extremity supported;During functional activity Standing balance-Leahy Scale: Fair Standing balance comment: R knee buckling                    ADL Overall ADL's : Needs assistance/impaired                 Upper Body Dressing : Supervision/safety;Cueing for compensatory techniques;Sitting Upper Body Dressing Details (indicate cue type and reason): Cues to dress L side first Lower Body Dressing: Set up;With adaptive equipment;Sitting/lateral leans   Toilet Transfer: Min guard;Ambulation;BSC (1 crutch)   Toileting- Clothing Manipulation and Hygiene: Min guard;Sitting/lateral lean;Sit to/from stand   Tub/ Shower Transfer: Tub transfer;Supervision/safety;Ambulation;Tub bench (1 crutch)   Functional mobility during ADLs: Min guard (1 crutch) General ADL Comments: Provided and practiced with AE for ADL. Discussed compensatory strategies, fall prevention and fall recovery strategies (rugs, cords, pets, lighting, using bathroom at night), and HEP for LUE. Mod verbal cues to incorporate LUE for functional tasks.      Vision                     Perception     Praxis      Cognition   Behavior During Therapy: [facility] for tasks assessed/performed Overall Cognitive Status: Within Functional Limits for tasks assessed                       Extremity/Trunk Assessment               Exercises General Exercises - Upper Extremity Shoulder Flexion: AROM;Left;10 reps;Seated Shoulder Extension: AROM;Left;10 reps;Seated Shoulder ABduction: AROM;10 reps;Seated Shoulder ADduction: AROM;Left;10 reps;Seated Elbow Flexion: AROM;10 reps;Seated;Left Elbow Extension: AROM;Left;10 reps;Seated Wrist Flexion: AROM;10 reps;Seated;Left Wrist  Extension: AROM;Left;10 reps;Seated Digit Composite Flexion: AROM;10 reps;Left;Squeeze ball;Other (comment) (pink theraputty) General Exercises - Lower Extremity Ankle Circles/Pumps: AROM;Both;10 reps Long Arc Quad: Left;5 reps;Seated;AROM (pt with limited AROM) Hip Flexion/Marching: AROM;Left;5 reps;Seated Hand Exercises Digit Composite Abduction: AROM;Left;10 reps;Seated;Squeeze  ball;Other (comment) (pink theraputty) Digit Composite Adduction: AROM;Left;10 reps;Seated;Squeeze ball;Other (comment) (pink theraputty) Thumb Abduction: AROM;Left;10 reps;Seated;Other (comment) (pink theraputty) Thumb Adduction: AROM;Left;10 reps;Seated;Other (comment) (pink theraputty) Opposition: AROM;Left;10 reps;Seated;Other (comment);Squeeze ball (pink theraputty) Other Exercises Other Exercises: encouraged pt to use LUE functionally   Shoulder Instructions       General Comments      Pertinent Vitals/ Pain       Pain Assessment: 0-10 Pain Score: 4  Pain Location: L shoulder and R knee (arthritis) Pain Descriptors / Indicators: Aching Pain Intervention(s): Limited activity within patient's tolerance;Monitored during session;Repositioned  Home Living                                          Prior Functioning/Environment              Frequency Min 3X/week     Progress Toward Goals  OT Goals(current goals can now be found in the care plan section)  Progress towards OT goals: Progressing toward goals  Acute Rehab OT Goals Patient Stated Goal: to go home OT Goal Formulation: With patient Time For Goal Achievement: 03/13/15 Potential to Achieve Goals: Good ADL Goals Pt Will Perform Lower Body Bathing: with set-up;with supervision;with adaptive equipment;with caregiver independent in assisting;sit to/from stand Pt Will Perform Lower Body Dressing: with set-up;with supervision;with adaptive equipment;with caregiver independent in assisting;sit to/from stand Pt Will Transfer to Toilet: with modified independence;ambulating;bedside commode Pt Will Perform Tub/Shower Transfer: with supervision;tub bench;rolling walker;ambulating Pt/caregiver will Perform Home Exercise Program: Increased ROM;Increased strength;Left upper extremity;With theraputty;With written HEP provided;Independently  Plan Discharge plan remains appropriate    Co-evaluation                  End of Session Equipment Utilized During Treatment: Gait belt;Other (comment) (crutch)   Activity Tolerance Patient tolerated treatment well   Patient Left in chair;with call bell/phone within reach;with chair alarm set   Nurse Communication Mobility status        Time: [id number] OT Time Calculation (min): 29 min  Charges: OT General Charges $OT Visit: 1 Procedure OT Treatments $Self Care/Home Management : 23-37 mins  Redmond Baseman, OTR/L 03/02/2015, 1:29 PM

## 2015-03-02 NOTE — Care Management Note (Signed)
Case Management Note  Patient Details  Name: Richard Soto MRN: [id number] Date of Birth: June 28, 1965  Subjective/Objective:                    Action/Plan: Plan is for patient to be discharged home with home health service. CM met with the patient and provided him a list of home health agencies in the [facility] area. He chose [facility]. Tiffany with [facility] notified and accepted the referral. Patient also with no PCP listed and CM instructed him that he is assigned a MD/ Practice through Medicaid and he needs to check his card for the MD. Patient agrees to check card and make an appointment with his assigned MD. Will update the bedside RN.   Expected Discharge Date:  03/04/15               Expected Discharge Plan:  [place]  In-House Referral:     Discharge planning Services  CM Consult  Post Acute Care Choice:  Home Health Choice offered to:  Patient  DME Arranged:    DME Agency:     HH Arranged:  PT [place]:  [place]  Status of Service:  Completed, signed off  Medicare Important Message Given:    Date Medicare IM Given:    Medicare IM give by:    Date Additional Medicare IM Given:    Additional Medicare Important Message give by:     If discussed at [place] of Stay Meetings, dates discussed:    Additional Comments:  Pollie Friar, RN 03/02/2015, 4:11 PM

## 2015-03-02 NOTE — Discharge Instructions (Signed)
·   No driving until cleared by physician  Home health physical therapy  Follow-up with primary care physician within 2 weeks.  Report any abnormal bleeding to your physician immediately

## 2015-03-02 NOTE — Progress Notes (Signed)
Physical Therapy Treatment Patient Details Name: Tyde Witte MRN: [id number] DOB: Mar 16, 1965 Today's Date: 03/02/2015    History of Present Illness pt presents with L sided weakness and negative MRI.  pt positive for Etoh, Benzos, and Opiates on admit. 12/18 with chest pain and CT angio chest negative.  pt with hx of HTN and PE.      PT Comments    Patient reports h/o falls in past 6 months due to Rt knee buckling (severe arthritis). States he has not fallen when using crutches. Provided with single crutch to use with RUE and pt demonstrated proper sequencing/use of crutch. Educated in AROM LLE and encouraged to continue throughout the day.    Follow Up Recommendations  Home health PT;Supervision for mobility/OOB     Equipment Recommendations  None recommended by PT    Recommendations for Other Services       Precautions / Restrictions Precautions Precautions: Fall Restrictions Weight Bearing Restrictions: No    Mobility  Bed Mobility Overal bed mobility: Modified Independent Bed Mobility: Supine to Sit     Supine to sit: Modified independent (Device/Increase time)     General bed mobility comments: no difficulties noted  Transfers Overall transfer level: Needs assistance Equipment used: None;Crutches Transfers: Sit to/from Stand Sit to Stand: Min guard         General transfer comment: no device with no imbalance; with one crutch pt used proper technique without cues  Ambulation/Gait Ambulation/Gait assistance: Min guard Ambulation Distance (Feet): 120 Feet (seated rest, 120) Assistive device: None;Crutches Gait Pattern/deviations: Step-through pattern;Decreased stride length;Decreased dorsiflexion - left     General Gait Details: pt antalgic and favoring Rt knee during ambulation w/ c/o Rt knee feeling as though it will buckle (no buckle noted). Lt foot dragging slightly; 120 ft no device and then 120 ft with Rt crutch   Stairs Stairs:  (refused due  to knee pain)          Wheelchair Mobility    Modified Rankin (Stroke Patients Only) Modified Rankin (Stroke Patients Only) Pre-Morbid Rankin Score: No significant disability Modified Rankin: Moderately severe disability     Balance     Sitting balance-Leahy Scale: Good       Standing balance-Leahy Scale: Fair                      Cognition Arousal/Alertness: Awake/alert Behavior During Therapy: WFL for tasks assessed/performed Overall Cognitive Status: Within Functional Limits for tasks assessed                      Exercises General Exercises - Lower Extremity Ankle Circles/Pumps: AROM;Both;10 reps Long Arc Quad: Left;5 reps;Seated;AROM (pt with limited AROM) Hip Flexion/Marching: AROM;Left;5 reps;Seated Other Exercises Other Exercises: encouraged pt to use LUE functionally    General Comments        Pertinent Vitals/Pain Pain Assessment: No/denies pain    Home Living                      Prior Function            PT Goals (current goals can now be found in the care plan section) Acute Rehab PT Goals Patient Stated Goal: To be able to move my Lt hand Time For Goal Achievement: 03/12/15 Progress towards PT goals: Progressing toward goals    Frequency  Min 4X/week    PT Plan Current plan remains appropriate    Co-evaluation  End of Session Equipment Utilized During Treatment: Gait belt Activity Tolerance: Patient tolerated treatment well Patient left: with call bell/phone within reach;in chair;with chair alarm set     Time: [phone] PT Time Calculation (min) (ACUTE ONLY): 15 min  Charges:  $Gait Training: 8-22 mins                    G Codes:      Arel Tippen 03/14/15, 10:01 AM Pager [phone]

## 2015-03-02 NOTE — Discharge Summary (Signed)
Stroke Discharge Summary  Patient ID: Richard Soto   MRN: [id number]      DOB: 10-03-1965  Date of Admission: 02/24/2015 Date of Discharge: 03/02/2015  Attending Physician:  Garvin Fila, MD, Stroke MD  Consulting Physician(s):     pulmonary/intensive care and rehabilitation medicine  Patient's PCP:  No PCP Per Patient  DISCHARGE DIAGNOSIS: Subjective left hemiparesis off nonorganic etiology with significant underlying psychosocial stressors Active Problems:   Numbness   Stroke [facility])   CVA (cerebral infarction)   Chest discomfort   Shortness of breath   Left hemiplegia (HCC)  BMI: Body mass index is 35.29 kg/(m^2).  Past Medical History  Diagnosis Date  . Pulmonary embolism [facility])    Past Surgical History  Procedure Laterality Date  . Replacement total knee    . Radiology with anesthesia N/A 02/24/2015    Procedure: RADIOLOGY WITH ANESTHESIA;  Surgeon: Medication Radiologist, MD;  Location: [place];  Service: Radiology;  Laterality: N/A;      Medication List    STOP taking these medications        aspirin EC 81 MG tablet  Replaced by:  aspirin 325 MG tablet      TAKE these medications        acetaminophen 325 MG tablet  Commonly known as:  TYLENOL  Take 2 tablets (650 mg total) by mouth every 6 (six) hours as needed for mild pain (temp >/= 99.5).     acetaminophen-codeine 300-30 MG tablet  Commonly known as:  TYLENOL #3  Take 1 tablet by mouth every 8 (eight) hours as needed for moderate pain.     amLODipine 10 MG tablet  Commonly known as:  NORVASC  Take 1 tablet (10 mg total) by mouth daily.     aspirin 325 MG tablet  Take 1 tablet (325 mg total) by mouth daily.     atorvastatin 20 MG tablet  Commonly known as:  LIPITOR  Take 1 tablet (20 mg total) by mouth daily at 6 PM.     Fish Oil 1000 MG Caps  Take 1 capsule by mouth daily.     folic acid 1 MG tablet  Commonly known as:  FOLVITE  Take 1 tablet (1 mg total) by mouth daily.     LORazepam 1  MG tablet  Commonly known as:  ATIVAN  Take 1 tablet (1 mg total) by mouth every 8 (eight) hours as needed for anxiety.     multivitamin tablet  Take 1 tablet by mouth daily.     tamsulosin 0.4 MG Caps capsule  Commonly known as:  FLOMAX  Take 1 capsule (0.4 mg total) by mouth daily after breakfast.     thiamine 100 MG tablet  Take 1 tablet (100 mg total) by mouth daily.     traMADol 50 MG tablet  Commonly known as:  ULTRAM  Take 2 tablets (100 mg total) by mouth every 6 (six) hours as needed for moderate pain or severe pain.     traZODone 100 MG tablet  Commonly known as:  DESYREL  Take 50 mg by mouth at bedtime.        LABORATORY STUDIES CBC    Component Value Date/Time   WBC 6.9 03/01/2015 1130   RBC 4.39 03/01/2015 1130   HGB 14.3 03/01/2015 1130   HCT 42.6 03/01/2015 1130   PLT 216 03/01/2015 1130   MCV 97.0 03/01/2015 1130   MCH 32.6 03/01/2015 1130   MCHC 33.6 03/01/2015  1130   RDW 12.1 03/01/2015 1130   LYMPHSABS 3.7 02/24/2015 2233   MONOABS 0.6 02/24/2015 2233   EOSABS 0.2 02/24/2015 2233   BASOSABS 0.0 02/24/2015 2233   CMP    Component Value Date/Time   NA 137 03/01/2015 1130   K 4.2 03/01/2015 1130   CL 103 03/01/2015 1130   CO2 25 03/01/2015 1130   GLUCOSE 99 03/01/2015 1130   BUN 11 03/01/2015 1130   CREATININE 0.65 03/01/2015 1130   CALCIUM 9.9 03/01/2015 1130   PROT 8.1 02/24/2015 2233   ALBUMIN 4.5 02/24/2015 2233   AST 29 02/24/2015 2233   ALT 42 02/24/2015 2233   ALKPHOS 123 02/24/2015 2233   BILITOT 0.3 02/24/2015 2233   GFRNONAA >60 03/01/2015 1130   GFRAA >60 03/01/2015 1130   COAGS Lab Results  Component Value Date   INR 1.05 02/24/2015   Lipid Panel    Component Value Date/Time   CHOL 133 02/25/2015 1133   TRIG 67 02/25/2015 1133   HDL 37* 02/25/2015 1133   CHOLHDL 3.6 02/25/2015 1133   VLDL 13 02/25/2015 1133   LDLCALC 83 02/25/2015 1133   HgbA1C  Lab Results  Component Value Date   HGBA1C 5.8* 02/25/2015    Cardiac Panel (last 3 results) No results for input(s): CKTOTAL, CKMB, TROPONINI, RELINDX in the last 72 hours. Urinalysis No results found for: COLORURINE, APPEARANCEUR, LABSPEC, PHURINE, GLUCOSEU, HGBUR, BILIRUBINUR, KETONESUR, PROTEINUR, UROBILINOGEN, NITRITE, LEUKOCYTESUR Urine Drug Screen     Component Value Date/Time   LABOPIA POSITIVE* 02/25/2015 [place] 02/25/2015 1643   LABBENZ POSITIVE* 02/25/2015 1643   AMPHETMU NONE DETECTED 02/25/2015 [place] 02/25/2015 1643   LABBARB NONE DETECTED 02/25/2015 1643    Alcohol Level    Component Value Date/Time   ETH 273* 02/24/2015 2334     SIGNIFICANT DIAGNOSTIC STUDIES  Ct Head Wo Contrast 02/24/2015 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 No hemorrhagic conversion.   Ct Angio Chest Pe W/cm &/or Wo Cm 02/25/2015 1. No evidence of significant pulmonary embolus. 2. Minimal bilateral atelectasis noted. Lungs otherwise clear. 3. Scattered coronary artery calcifications seen.   Dg Chest Port 1 View 02/25/2015 Mild peribronchial thickening noted. Lungs otherwise clear.    MRI of Head 02/25/2015 1. Normal MRI appearance the brain. No evidence for acute or subacute infarction. 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   CT angiogram of the chest 03/01/2015 No evidence of pulmonary embolism. Minimal atelectasis dependently.  Cerebral angiogram 03/02/2015 Angiographically, no evidence of occlusions, stenosis, dissections or of intraluminal filling defects seen. No evidence of aneurysms, or arteriovenous shunting. Venous outflow within normal limits.  2-D echocardiogram 02/25/2015 Study Conclusions - Left ventricle: The cavity size was normal. Wall thickness was normal. Systolic function was normal. The estimated ejection fraction was in the range of 60% to 65%. Wall motion was normal; there  were no regional wall motion abnormalities.        HISTORY OF PRESENT ILLNESS Richard Soto is an 49 y.o. male with a history of hypertension, pulmonary embolus and hyperlipidemia, brought to the emergency room following onset of weakness and numbness involving his left side as well as slurred speech. He was last known well at 6 PM on 02/24/2015. He has no previous history of stroke nor TIA. Anticoagulation was discontinued one month prior to admission. He was not on antiplatelet therapy. CT scan of his head showed increased  density of distal right M1 MCA. There was also early signs of possible ischemic changes in right MCA territory. NIH stroke score was 15. A pressure was elevated requiring intervention with intravenous labetalol. Patient was beyond time window for treatment consideration with IV TPA. Interventional radiology was consulted and patient underwent catheter cerebral angiogram. Study showed no occlusion or severe stenosis involving right ICA nor MCA. Patient was transferred to neurology intensive care unit for further management.  LSN: 6 PM on 02/24/2015 tPA Given: No: Beyond time under for treatment consideration mRankin:  HOSPITAL COURSE  Richard Soto is a 49 y.o. male with history of hypertension, pulmonary embolus and hyperlipidemia presenting with left sided weakness and numbness with slurred speech. He did not receive IV t-PA due to beyond treatment window; he had no large vessel occlusion on catheter angiogram.   Stroke like symptoms possibly nonorganic etiology with negative MRI scan  Resultant Left hemiparesis, dysarthria with nonorganic features  Catheter angiogram no large vessel occlusion of right ICA MCA (intubated for procedure)  MRI negative for infarct.  2D Echo EF 60-65%. No cardiac source of emboli identified.  LDL 83  HgbA1c 5.8  VTE prophylaxis - now on Lovenox. SCDs discontinued.  DIET DYS 3 Room service appropriate?: Yes; Fluid consistency::  Thin  No antithrombotic prior to admission,Start aspirin 325 mg daily   Ongoing aggressive stroke risk factor management  Therapy recommendations: Home health physical therapy recommended.  Disposition: Discharged to home  Hypertension  Stable  Permissive hypertension (OK if < 220/120) but gradually normalize in 5-7 days  Hyperlipidemia  Home meds: OMEGA-3 FATTY ACIDS  LDL 83 , goal < 70  Start low-dose Lipitor 20 mg daily  Continue statin at time of discharge.  Other Stroke Risk Factors  Cigarette smoker, advised to stop smoking  ETOH use per EMR - pt denies  UDS - positive for opiates and benzodiazepines. Substance abuse history from Epic EMR.   Other Active Problems  [facility] History of pulmonary embolism suspected to have come from his RLE. Xarelto stopped due to GIB.  Colonoscopy planned. - they were unable to contact him for follow up. LE doppler neg on 01/14/2015.   Knee and shoulder pain - tramadol and Tylenol # 3 as needed.  Paradoxical response to fentanyl 02/26/2015. Now on tramadol and Tylenol # 3 when necessary.  Anxiety and depression - suggest further evaluation on outpatient basis  Urinary retention - Flomax started   Chest pain / shortness of breath - normal EKG; ABGs consistent with hyperventilation; unremarkable portable chest x-ray; CT angiogram of the chest negative for pulmonary embolus. Episode felt secondary to anxiety.  Discussed prescription pain medications and anxiolytics with Dr. Leonie Man. Prescribed 2 week supply. Patient to follow-up with primary care physician for further recommendations.   DISCHARGE EXAM Blood pressure 134/86, pulse 70, temperature 98.5 F (36.9 C), temperature source Oral, resp. rate 18, height 5\' 4"  (1.626 m), weight 93.3 kg (205 lb 11 oz), SpO2 100 %.   General - 49 year old male seated in a chair in no acute distress. Heart - Regular rate and rhythm - no murmer Lungs - Clear to  auscultation Skin - Warm and dry  Neurological exam Awake alert oriented 3. Mild dysarthria. No aphasia. Follows commands well. Extraocular movements are full range no field cut. Pupils equal reactive. Fundi were not visualized. Vision acuity seems adequate. No facial weakness. Tongue midline. Mild hemiplegia with grade 4/5 strength in the left upper extremity with significant weakness of the grip and  distal hand muscles 3/5 left lower extremity weakness with trace withdrawal to pain extremely poor and variable effort while testing the left side. . Subjective diminished touch pinprick and vibration sensation on the left hemibody including forehead with splitting of the midline for touch as well as vibration sensation over the forehead. Left plantar upgoing right downgoing. Deep tendon reflexes 2+ on the right and absent on the left. Gait was not tested.   Discharge Diet   Diet Heart Room service appropriate?: Yes; Fluid consistency:: Thin liquids  DISCHARGE PLAN  Disposition:  Discharged to home  aspirin 325 mg daily for secondary stroke prevention.  Follow-up with Medicaid provided physician in 2 weeks.  Follow-up with Dr. Antony Contras, [facility] in 2 months.  Discharge instructions to patient  No driving until cleared by physician  Home health physical therapy  Follow-up with primary care physician within 2 weeks.  Report any abnormal bleeding to your physician immediately  25 minutes were spent preparing discharge.  Richard Bussing PA-C [facility] Pager [phone] 03/02/2015, 5:10 PM I have personally examined this patient, reviewed notes, independently viewed imaging studies, participated in medical decision making and plan of care. I have made any additions or clarifications directly to the above note. Agree with note above.   Antony Contras, MD Medical Director [facility] Pager: [phone] 03/02/2015 5:15 PM

## 2015-03-03 LAB — LUPUS ANTICOAGULANT PANEL
DRVVT: 44.8 s — ABNORMAL HIGH (ref 0.0–44.0)
PTT Lupus Anticoagulant: 50.5 s — ABNORMAL HIGH (ref 0.0–40.6)

## 2015-03-03 LAB — HEXAGONAL PHASE PHOSPHOLIPID: HEXAGONAL PHASE PHOSPHOLIPID: 12 s — AB (ref 0–11)

## 2015-03-03 LAB — PTT-LA MIX: PTT-LA MIX: 48.2 s — AB (ref 0.0–40.6)

## 2015-03-03 LAB — DRVVT MIX: DRVVT MIX: 40.3 s (ref 0.0–44.0)

## 2015-03-04 LAB — BETA-2-GLYCOPROTEIN I ABS, IGG/M/A
Beta-2-Glycoprotein I IgA: <9 GPI IgA units (ref 0–25)
Beta-2-Glycoprotein I IgM: <9 GPI IgM units (ref 0–32)

## 2015-03-04 LAB — PROTHROMBIN GENE MUTATION

## 2015-03-05 ENCOUNTER — Other Ambulatory Visit: Payer: Self-pay

## 2015-03-05 NOTE — Patient Outreach (Signed)
Call made to consent patient to [facility] Stroke Transition series; no answer. Will try again week of 03/09/15.

## 2015-03-06 LAB — FACTOR 5 LEIDEN

## 2015-04-15 DIAGNOSIS — D126 Benign neoplasm of colon, unspecified: Secondary | ICD-10-CM

## 2015-04-15 HISTORY — DX: Benign neoplasm of colon, unspecified: D12.6

## 2015-04-17 ENCOUNTER — Emergency Department (HOSPITAL_COMMUNITY): Payer: Medicaid Other

## 2015-04-17 ENCOUNTER — Encounter (HOSPITAL_COMMUNITY): Payer: Self-pay | Admitting: Family Medicine

## 2015-04-17 ENCOUNTER — Emergency Department (HOSPITAL_COMMUNITY)
Admission: EM | Admit: 2015-04-17 | Discharge: 2015-04-17 | Disposition: A | Discharge status: Home / Self Care | Payer: Medicaid Other | Attending: Emergency Medicine | Admitting: Emergency Medicine

## 2015-04-17 DIAGNOSIS — Z79899 Other long term (current) drug therapy: Secondary | ICD-10-CM | POA: Diagnosis not present

## 2015-04-17 DIAGNOSIS — I10 Essential (primary) hypertension: Secondary | ICD-10-CM | POA: Insufficient documentation

## 2015-04-17 DIAGNOSIS — R0602 Shortness of breath: Secondary | ICD-10-CM | POA: Diagnosis not present

## 2015-04-17 DIAGNOSIS — Z8673 Personal history of transient ischemic attack (TIA), and cerebral infarction without residual deficits: Secondary | ICD-10-CM | POA: Diagnosis not present

## 2015-04-17 DIAGNOSIS — R079 Chest pain, unspecified: Secondary | ICD-10-CM | POA: Diagnosis present

## 2015-04-17 DIAGNOSIS — R1013 Epigastric pain: Secondary | ICD-10-CM | POA: Diagnosis not present

## 2015-04-17 DIAGNOSIS — Z7982 Long term (current) use of aspirin: Secondary | ICD-10-CM | POA: Insufficient documentation

## 2015-04-17 DIAGNOSIS — F17200 Nicotine dependence, unspecified, uncomplicated: Secondary | ICD-10-CM | POA: Insufficient documentation

## 2015-04-17 DIAGNOSIS — Z86711 Personal history of pulmonary embolism: Secondary | ICD-10-CM | POA: Insufficient documentation

## 2015-04-17 HISTORY — DX: Cerebral infarction, unspecified: I63.9

## 2015-04-17 HISTORY — DX: Essential (primary) hypertension: I10

## 2015-04-17 LAB — CBC
HCT: 44.5 % (ref 39.0–52.0)
Hemoglobin: 15.4 g/dL (ref 13.0–17.0)
MCH: 31.9 pg (ref 26.0–34.0)
MCHC: 34.6 g/dL (ref 30.0–36.0)
MCV: 92.1 fL (ref 78.0–100.0)
PLATELETS: 189 10*3/uL (ref 150–400)
RBC: 4.83 MIL/uL (ref 4.22–5.81)
RDW: 12 % (ref 11.5–15.5)
WBC: 11.1 10*3/uL — AB (ref 4.0–10.5)

## 2015-04-17 LAB — URINALYSIS, ROUTINE W REFLEX MICROSCOPIC
Bilirubin Urine: NEGATIVE
GLUCOSE, UA: NEGATIVE mg/dL
Hgb urine dipstick: NEGATIVE
KETONES UR: NEGATIVE mg/dL
LEUKOCYTES UA: NEGATIVE
Nitrite: NEGATIVE
PH: 6 (ref 5.0–8.0)
Protein, ur: NEGATIVE mg/dL
Specific Gravity, Urine: 1.01 (ref 1.005–1.030)

## 2015-04-17 LAB — BASIC METABOLIC PANEL
Anion gap: 12 (ref 5–15)
BUN: 14 mg/dL (ref 6–20)
CALCIUM: 10.1 mg/dL (ref 8.9–10.3)
CO2: 24 mmol/L (ref 22–32)
CREATININE: 0.61 mg/dL (ref 0.61–1.24)
Chloride: 100 mmol/L — ABNORMAL LOW (ref 101–111)
GFR calc Af Amer: >60 mL/min (ref >60)
Glucose, Bld: 120 mg/dL — ABNORMAL HIGH (ref 65–99)
Potassium: 4 mmol/L (ref 3.5–5.1)
SODIUM: 136 mmol/L (ref 135–145)

## 2015-04-17 LAB — LIPASE, BLOOD: LIPASE: 23 U/L (ref 11–51)

## 2015-04-17 LAB — HEPATIC FUNCTION PANEL
ALT: 23 U/L (ref 17–63)
AST: 19 U/L (ref 15–41)
Albumin: 3.7 g/dL (ref 3.5–5.0)
Alkaline Phosphatase: 115 U/L (ref 38–126)
BILIRUBIN DIRECT: 0.2 mg/dL (ref 0.1–0.5)
Indirect Bilirubin: 0.8 mg/dL (ref 0.3–0.9)
Total Bilirubin: 1 mg/dL (ref 0.3–1.2)
Total Protein: 6.5 g/dL (ref 6.5–8.1)

## 2015-04-17 LAB — BRAIN NATRIURETIC PEPTIDE: B NATRIURETIC PEPTIDE 5: 14.3 pg/mL (ref 0.0–100.0)

## 2015-04-17 LAB — D-DIMER, QUANTITATIVE: D-Dimer, Quant: <0.27 ug/mL-FEU (ref 0.00–0.50)

## 2015-04-17 LAB — I-STAT TROPONIN, ED
TROPONIN I, POC: 0 ng/mL (ref 0.00–0.08)
Troponin i, poc: 0 ng/mL (ref 0.00–0.08)

## 2015-04-17 MED ORDER — HYDROMORPHONE HCL 1 MG/ML IJ SOLN
1.0000 mg | Freq: Once | INTRAMUSCULAR | Status: AC
  Administered 2015-04-17: 1 mg via INTRAVENOUS
  Filled 2015-04-17: qty 1

## 2015-04-17 MED ORDER — IOHEXOL 350 MG/ML SOLN
100.0000 mL | Freq: Once | INTRAVENOUS | Status: AC | PRN
  Administered 2015-04-17: 100 mL via INTRAVENOUS

## 2015-04-17 MED ORDER — SODIUM CHLORIDE 0.9 % IV BOLUS (SEPSIS)
1000.0000 mL | Freq: Once | INTRAVENOUS | Status: AC
  Administered 2015-04-17: 1000 mL via INTRAVENOUS

## 2015-04-17 MED ORDER — PANTOPRAZOLE SODIUM 40 MG PO TBEC
40.0000 mg | DELAYED_RELEASE_TABLET | Freq: Once | ORAL | Status: AC
  Administered 2015-04-17: 40 mg via ORAL
  Filled 2015-04-17: qty 1

## 2015-04-17 MED ORDER — GI COCKTAIL ~~LOC~~: 30.0000 mL | Freq: Once | ORAL | Status: DC

## 2015-04-17 MED ORDER — MORPHINE SULFATE (PF) 4 MG/ML IV SOLN
4.0000 mg | Freq: Once | INTRAVENOUS | Status: AC
  Administered 2015-04-17: 4 mg via INTRAVENOUS
  Filled 2015-04-17: qty 1

## 2015-04-17 MED ORDER — PANTOPRAZOLE SODIUM 40 MG PO TBEC: 40.0000 mg | DELAYED_RELEASE_TABLET | Freq: Two times a day (BID) | ORAL | Status: DC

## 2015-04-17 MED ORDER — ALUM & MAG HYDROXIDE-SIMETH 200-200-20 MG/5ML PO SUSP
30.0000 mL | Freq: Once | ORAL | Status: AC
  Administered 2015-04-17: 30 mL via ORAL
  Filled 2015-04-17: qty 30

## 2015-04-17 NOTE — ED Notes (Signed)
Reported to Dr. Lanetta Inch pt.s pain level and not changed, continues to be 10/10

## 2015-04-17 NOTE — ED Notes (Signed)
Pt. Is aware of needing an urine specimen 

## 2015-04-17 NOTE — ED Notes (Signed)
Pt here for chest pain, SOB that started yesterday and worse this am. Pt labored breathing at triage. sts hurts with breathing.

## 2015-04-17 NOTE — ED Notes (Signed)
Pt unable to void at this time. 

## 2015-04-17 NOTE — ED Provider Notes (Signed)
CSN: [id number]     Arrival date & time 04/17/15  1537 History   First MD Initiated Contact with Patient 04/17/15 1555     Chief Complaint  Patient presents with  . Chest Pain  . Shortness of Breath     (Consider location/radiation/quality/duration/timing/severity/associated sxs/prior Treatment) Patient is a 50 y.o. male presenting with abdominal pain. The history is provided by the patient.  Abdominal Pain Pain location:  Epigastric Pain quality: aching, burning and throbbing   Pain radiates to:  Does not radiate Pain severity:  Moderate Onset quality:  Gradual Duration:  2 days Timing:  Constant Progression:  Waxing and waning Chronicity:  New Context: not recent illness and not trauma   Relieved by:  None tried Worsened by:  Movement Ineffective treatments:  None tried Associated symptoms: chest pain (left chest) and shortness of breath   Associated symptoms: no cough, no dysuria, no fever, no hematuria and no nausea     Past Medical History  Diagnosis Date  . Pulmonary embolism ([place])   . Stroke ([place])   . Hypertension    Past Surgical History  Procedure Laterality Date  . Replacement total knee    . Radiology with anesthesia N/A 02/24/2015    Procedure: RADIOLOGY WITH ANESTHESIA;  Surgeon: Medication Radiologist, MD;  Location: [facility];  Service: Radiology;  Laterality: N/A;   History reviewed. No pertinent family history. Social History  Substance Use Topics  . Smoking status: Light Tobacco Smoker  . Smokeless tobacco: None  . Alcohol Use: No    Review of Systems  Constitutional: Negative for fever.  HENT: Negative.   Eyes: Negative for visual disturbance.  Respiratory: Positive for shortness of breath. Negative for cough.   Cardiovascular: Positive for chest pain (left chest).  Gastrointestinal: Positive for abdominal pain. Negative for nausea.  Genitourinary: Negative for dysuria and hematuria.  Musculoskeletal: Negative.   Skin: Negative.    Neurological: Negative.       Allergies  Fentanyl  Home Medications   Prior to Admission medications   Medication Sig Start Date End Date Taking? Authorizing Provider  acetaminophen (TYLENOL) 325 MG tablet Take 2 tablets (650 mg total) by mouth every 6 (six) hours as needed for mild pain (temp >/= 99.5). 03/02/15   David L Rinehuls, PA-C  acetaminophen-codeine (TYLENOL #3) 300-30 MG tablet Take 1 tablet by mouth every 8 (eight) hours as needed for moderate pain. 03/02/15   David L Rinehuls, PA-C  amLODipine (NORVASC) 10 MG tablet Take 1 tablet (10 mg total) by mouth daily. 03/02/15   David L Rinehuls, PA-C  aspirin 325 MG tablet Take 1 tablet (325 mg total) by mouth daily. 03/02/15   David L Rinehuls, PA-C  atorvastatin (LIPITOR) 20 MG tablet Take 1 tablet (20 mg total) by mouth daily at 6 PM. 03/02/15   David L Rinehuls, PA-C  folic acid (FOLVITE) 1 MG tablet Take 1 tablet (1 mg total) by mouth daily. 03/02/15   David L Rinehuls, PA-C  LORazepam (ATIVAN) 1 MG tablet Take 1 tablet (1 mg total) by mouth every 8 (eight) hours as needed for anxiety. 03/02/15   David L Rinehuls, PA-C  Multiple Vitamin (MULTIVITAMIN) tablet Take 1 tablet by mouth daily.    Historical Provider, MD  Omega-3 Fatty Acids (FISH OIL) 1000 MG CAPS Take 1 capsule by mouth daily.    Historical Provider, MD  pantoprazole (PROTONIX) 40 MG tablet Take 1 tablet (40 mg total) by mouth 2 (two) times daily. 04/17/15   Dorothea Ogle  Pricsilla Lindvall, MD  tamsulosin (FLOMAX) 0.4 MG CAPS capsule Take 1 capsule (0.4 mg total) by mouth daily after breakfast. 03/02/15   Early Chars Rinehuls, PA-C  thiamine 100 MG tablet Take 1 tablet (100 mg total) by mouth daily. 03/02/15   David L Rinehuls, PA-C  traMADol (ULTRAM) 50 MG tablet Take 2 tablets (100 mg total) by mouth every 6 (six) hours as needed for moderate pain or severe pain. 03/02/15   David L Rinehuls, PA-C  traZODone (DESYREL) 100 MG tablet Take 50 mg by mouth at bedtime. 12/17/14   Historical  Provider, MD   BP 136/86 mmHg  Pulse 72  Temp(Src) 98.4 F (36.9 C) (Oral)  Resp 12  SpO2 95% Physical Exam  Constitutional: He is oriented to person, place, and time. He appears well-developed and well-nourished. No distress.  Appears in pain  HENT:  Head: Normocephalic and atraumatic.  Eyes: EOM are normal. Pupils are equal, round, and reactive to light.  Neck: Normal range of motion. Neck supple.  Cardiovascular: Normal rate, regular rhythm, normal heart sounds and intact distal pulses.   Pulmonary/Chest: Effort normal and breath sounds normal. He exhibits tenderness (left chest).  Abdominal: Soft. He exhibits no distension. There is tenderness (epigastric and left flank). There is no rebound and no guarding.  Musculoskeletal: Normal range of motion. He exhibits no edema or tenderness.  Neurological: He is alert and oriented to person, place, and time. No cranial nerve deficit. He exhibits normal muscle tone. Coordination normal.  Skin: Skin is warm and dry. No rash noted. He is not diaphoretic. No erythema. No pallor.  Psychiatric: He has a normal mood and affect.  Nursing note and vitals reviewed.   ED Course  Procedures (including critical care time) Labs Review Labs Reviewed  BASIC METABOLIC PANEL - Abnormal; Notable for the following:    Chloride 100 (*)    Glucose, Bld 120 (*)    All other components within normal limits  CBC - Abnormal; Notable for the following:    WBC 11.1 (*)    All other components within normal limits  URINALYSIS, ROUTINE W REFLEX MICROSCOPIC (NOT AT [facility]) - Abnormal; Notable for the following:    APPearance CLOUDY (*)    All other components within normal limits  BRAIN NATRIURETIC PEPTIDE  D-DIMER, QUANTITATIVE (NOT AT [facility])  LIPASE, BLOOD  HEPATIC FUNCTION PANEL  I-STAT TROPOININ, ED  I-STAT TROPOININ, ED    Imaging Review Dg Chest 2 View  04/17/2015  CLINICAL DATA:  Chest pain and shortness of breath for 2 days, history stroke,  hypertension, pulmonary embolism, smoking EXAM: CHEST  2 VIEW COMPARISON:  03/01/2015 FINDINGS: Normal heart size, mediastinal contours, and pulmonary vascularity. Lungs clear. No pleural effusion or pneumothorax. Bones unremarkable. IMPRESSION: No acute abnormalities. Electronically Signed   By: Lavonia Dana M.D.   On: 04/17/2015 16:45   Ct Angio Chest Pe W/cm &/or Wo Cm  04/17/2015  CLINICAL DATA:  Rule out aortic pathology EXAM: CT ANGIOGRAPHY CHEST CT ABDOMEN AND PELVIS WITH CONTRAST TECHNIQUE: Multidetector CT imaging of the chest was performed using the standard protocol during bolus administration of intravenous contrast. Multiplanar CT image reconstructions and MIPs were obtained to evaluate the vascular anatomy. Multidetector CT imaging of the abdomen and pelvis was performed using the standard protocol during bolus administration of intravenous contrast. CONTRAST:  176mL OMNIPAQUE IOHEXOL 350 MG/ML SOLN COMPARISON:  03/01/2015 FINDINGS: CTA CHEST FINDINGS Mediastinum: The heart size appears normal. There is no pericardial effusion identified. Aortic atherosclerosis noted.  Calcification within the LAD, left circumflex and RCA coronary arteries noted. No mediastinal or hilar adenopathy. No axillary or supraclavicular adenopathy. The main pulmonary artery appears patent. There is no saddle embolus identified. Exam detail degraded due to several factors including respiratory motion artifact as well as cardiac pulsation artifact. Within this limitation there is no convincing evidence for aortic dissection. The thoracic aorta has a normal caliber. The aortic arch and descending thoracic aorta are also normal in caliber. Lungs/Pleura: No pleural fluid identified. No airspace consolidation or atelectasis. Upper Abdomen: The visualized portions of the liver are notable for hepatic steatosis. The visualized portions of the spleen and adrenal glands are also normal. The visualized portions of the pancreas appear  normal. Musculoskeletal: There is a mild scoliosis deformity involving the thoracic spine. Associated degenerative disc disease is noted. Review of the MIP images confirms the above findings. CT ABDOMEN and PELVIS FINDINGS Hepatobiliary: There is mild hepatic steatosis. Previous cholecystectomy. No biliary dilatation. Pancreas: Negative Spleen: The spleen appears normal. Adrenals/Urinary Tract: The adrenal glands are normal.Unremarkable appearance of the kidneys. The urinary bladder appears normal. Stomach/Bowel: The stomach is within normal limits. The small bowel loops have a normal course and caliber. No obstruction. The appendix is visualized and appears normal. No pathologic dilatation of the colon. There is mild diffuse wall thickening involving the descending colon. Numerous distal colonic diverticula noted. Vascular/Lymphatic: Calcified atherosclerotic disease involves the abdominal aorta. No aneurysm. No evidence for aortic dissection. No enlarged retroperitoneal or mesenteric adenopathy. No enlarged pelvic or inguinal lymph nodes. Reproductive: The prostate gland and seminal vesicles appear normal. Other: There is no ascites or focal fluid collections within the abdomen or pelvis. Musculoskeletal: Degenerative disc disease noted within the lumbar spine. This is most advanced at the L5-S1 level. IMPRESSION: 1. Thoracic exam detail is diminished due to motion artifact. Within this limitation there is no evidence for aortic pathology. 2. Aortic atherosclerosis and coronary artery calcification. 3. No evidence for hydronephrosis or hydroureter. 4. There is mild wall thickening involving the descending colon which may reflect incomplete distention. Cannot rule out an inflammatory or infectious colitis. 5. Mild hepatic steatosis. Electronically Signed   By: Kerby Moors M.D.   On: 04/17/2015 19:38   Ct Abdomen Pelvis W Contrast  04/17/2015  CLINICAL DATA:  Rule out aortic pathology EXAM: CT ANGIOGRAPHY CHEST  CT ABDOMEN AND PELVIS WITH CONTRAST TECHNIQUE: Multidetector CT imaging of the chest was performed using the standard protocol during bolus administration of intravenous contrast. Multiplanar CT image reconstructions and MIPs were obtained to evaluate the vascular anatomy. Multidetector CT imaging of the abdomen and pelvis was performed using the standard protocol during bolus administration of intravenous contrast. CONTRAST:  174mL OMNIPAQUE IOHEXOL 350 MG/ML SOLN COMPARISON:  03/01/2015 FINDINGS: CTA CHEST FINDINGS Mediastinum: The heart size appears normal. There is no pericardial effusion identified. Aortic atherosclerosis noted. Calcification within the LAD, left circumflex and RCA coronary arteries noted. No mediastinal or hilar adenopathy. No axillary or supraclavicular adenopathy. The main pulmonary artery appears patent. There is no saddle embolus identified. Exam detail degraded due to several factors including respiratory motion artifact as well as cardiac pulsation artifact. Within this limitation there is no convincing evidence for aortic dissection. The thoracic aorta has a normal caliber. The aortic arch and descending thoracic aorta are also normal in caliber. Lungs/Pleura: No pleural fluid identified. No airspace consolidation or atelectasis. Upper Abdomen: The visualized portions of the liver are notable for hepatic steatosis. The visualized portions of the spleen and  adrenal glands are also normal. The visualized portions of the pancreas appear normal. Musculoskeletal: There is a mild scoliosis deformity involving the thoracic spine. Associated degenerative disc disease is noted. Review of the MIP images confirms the above findings. CT ABDOMEN and PELVIS FINDINGS Hepatobiliary: There is mild hepatic steatosis. Previous cholecystectomy. No biliary dilatation. Pancreas: Negative Spleen: The spleen appears normal. Adrenals/Urinary Tract: The adrenal glands are normal.Unremarkable appearance of the  kidneys. The urinary bladder appears normal. Stomach/Bowel: The stomach is within normal limits. The small bowel loops have a normal course and caliber. No obstruction. The appendix is visualized and appears normal. No pathologic dilatation of the colon. There is mild diffuse wall thickening involving the descending colon. Numerous distal colonic diverticula noted. Vascular/Lymphatic: Calcified atherosclerotic disease involves the abdominal aorta. No aneurysm. No evidence for aortic dissection. No enlarged retroperitoneal or mesenteric adenopathy. No enlarged pelvic or inguinal lymph nodes. Reproductive: The prostate gland and seminal vesicles appear normal. Other: There is no ascites or focal fluid collections within the abdomen or pelvis. Musculoskeletal: Degenerative disc disease noted within the lumbar spine. This is most advanced at the L5-S1 level. IMPRESSION: 1. Thoracic exam detail is diminished due to motion artifact. Within this limitation there is no evidence for aortic pathology. 2. Aortic atherosclerosis and coronary artery calcification. 3. No evidence for hydronephrosis or hydroureter. 4. There is mild wall thickening involving the descending colon which may reflect incomplete distention. Cannot rule out an inflammatory or infectious colitis. 5. Mild hepatic steatosis. Electronically Signed   By: Kerby Moors M.D.   On: 04/17/2015 19:38   I have personally reviewed and evaluated these images and lab results as part of my medical decision-making.   EKG Interpretation   Date/Time:  Friday April 17 2015 15:43:00 EST Ventricular Rate:  101 PR Interval:  120 QRS Duration: 92 QT Interval:  334 QTC Calculation: 433 R Axis:   76 Text Interpretation:  Sinus tachycardia Otherwise normal ECG Confirmed by  ZAVITZ  MD, JOSHUA ([id number]) on 04/17/2015 4:50:29 PM      MDM   Final diagnoses:  Epigastric pain    Patient is a 50 year old male with a history of PE and CAD who presents with  left-sided chest pain and left flank pain for the past 2 days. He also reports some shortness of breath but worse pain when he twists. He also reports some blood in his urine. Further history and exam as above notable for reassuring vital signs and tenderness to palpation along the left flank and reproducible left chest pain. Labs reveal a mild leukocytosis but otherwise negative d-dimer, negative BNP, negative troponin and UA without evidence of UTI or stone. Lipase and LFTs reassuring. Given patient's significant history we'll obtain a CT PE study as well as a CT abdomen and pelvis to assess for PEs, aortic pathology, or kidney stone. There is no evidence of acute pathology. Patient's symptoms improved with treatment here but still present. Concern for gastric ulcer at this time. She states he is supposed to have a EGD scheduled in the future but has not obtained a date yet. Doubt ACS, PE/pulmonary pathology or renal pathology at this time. Will give dose of ppi here  I have reviewed all imaging and labs. Patient stable for discharge home.  I have reviewed all results with the patient. Advised to f/u with gi within a week. Will rx ppi. Patient agrees to stated plan. All questions answered. Advised to call or return to have any questions, new symptoms, change in  symptoms, or symptoms that they do not understand.     Heriberto Antigua, MD 04/18/15 1058  Elnora Morrison, MD 04/18/15 [phone]

## 2015-04-17 NOTE — ED Notes (Signed)
Pt. Just placed in Room after X-ray,

## 2015-04-17 NOTE — Discharge Instructions (Signed)

## 2015-04-21 ENCOUNTER — Encounter: Payer: Self-pay | Admitting: Gastroenterology

## 2015-04-24 ENCOUNTER — Encounter: Payer: Self-pay | Admitting: Gastroenterology

## 2015-04-24 ENCOUNTER — Ambulatory Visit (INDEPENDENT_AMBULATORY_CARE_PROVIDER_SITE_OTHER): Payer: Medicaid Other | Admitting: Gastroenterology

## 2015-04-24 VITALS — BP 118/72 | HR 72 | Ht 69.0 in | Wt 204.1 lb

## 2015-04-24 DIAGNOSIS — K625 Hemorrhage of anus and rectum: Secondary | ICD-10-CM

## 2015-04-24 DIAGNOSIS — R1012 Left upper quadrant pain: Secondary | ICD-10-CM

## 2015-04-24 MED ORDER — NA SULFATE-K SULFATE-MG SULF 17.5-3.13-1.6 GM/177ML PO SOLN
1.0000 | Freq: Once | ORAL | Status: DC
Start: 2015-04-24 — End: 2015-04-28

## 2015-04-24 NOTE — Progress Notes (Addendum)
04/24/2015 Richard Soto [id number] [birth year]   HISTORY OF PRESENT ILLNESS:  This is a 50 year old male who is new to our practice. He was referred here from the emergency department for complaints of left upper quadrant abdominal pain. He says this pain has been present on and off for the past 6-7 months but got really bad recently. He says that the pain goes into the left side of his back. Says that it worsens with deep breaths.  He went to the emergency department on February 3 where he had a CT angiogram of the chest as well as CT of the abdomen and pelvis with contrast. These showed no definite cause for his pain. They said that he may have an ulcer so placed him on pantoprazole 40 mg twice daily and told him to follow up here. CBC, BMP, hepatic function panel, lipase were all unremarkable. He denies any associated nausea, vomiting, fever, diarrhea, or constipation. The pain does not really worsen with eating. He says that the pain does not worsen by pressing on his abdomen.  Just of note, the CT scan of the abdomen and pelvis showed mild wall thickening involving the descending colon, which they said may reflect incomplete distention. Once again, the patient has no complaints of diarrhea or bowel issues including recent rectal bleeding.  While he is here he says that in the past he has seen blood in his stool that was described as clots. He has not had any type of rectal bleeding recently.   Past Medical History  Diagnosis Date  . Pulmonary embolism ([place])   . Stroke ([place])   . Hypertension    Past Surgical History  Procedure Laterality Date  . Replacement total knee    . Radiology with anesthesia N/A 02/24/2015    Procedure: RADIOLOGY WITH ANESTHESIA;  Surgeon: Medication Radiologist, MD;  Location: [facility];  Service: Radiology;  Laterality: N/A;  . Cholecystectomy  2016    reports that he has been smoking Cigarettes.  He has been smoking about 0.50 packs per day. He has never used  smokeless tobacco. He reports that he drinks alcohol. He reports that he does not use illicit drugs. family history includes Diabetes in his mother; Lung cancer in his father. Allergies  Allergen Reactions  . Fentanyl Other (See Comments)    Paradoxical response      Outpatient Encounter Prescriptions as of 04/24/2015  Medication Sig  . amLODipine (NORVASC) 10 MG tablet Take 1 tablet (10 mg total) by mouth daily.  Marland Kitchen atorvastatin (LIPITOR) 20 MG tablet Take 1 tablet (20 mg total) by mouth daily at 6 PM.  . folic acid (FOLVITE) 1 MG tablet Take 1 tablet (1 mg total) by mouth daily.  Marland Kitchen LORazepam (ATIVAN) 1 MG tablet Take 1 tablet (1 mg total) by mouth every 8 (eight) hours as needed for anxiety.  . Multiple Vitamin (MULTIVITAMIN) tablet Take 1 tablet by mouth daily.  . Omega-3 Fatty Acids (FISH OIL) 1000 MG CAPS Take 1 capsule by mouth daily.  . pantoprazole (PROTONIX) 40 MG tablet Take 1 tablet (40 mg total) by mouth 2 (two) times daily.  . tamsulosin (FLOMAX) 0.4 MG CAPS capsule Take 1 capsule (0.4 mg total) by mouth daily after breakfast.  . thiamine 100 MG tablet Take 1 tablet (100 mg total) by mouth daily.  . [DISCONTINUED] acetaminophen (TYLENOL) 325 MG tablet Take 2 tablets (650 mg total) by mouth every 6 (six) hours as needed for mild pain (temp >/= 99.5).  . [  DISCONTINUED] acetaminophen-codeine (TYLENOL #3) 300-30 MG tablet Take 1 tablet by mouth every 8 (eight) hours as needed for moderate pain.  . [DISCONTINUED] aspirin 325 MG tablet Take 1 tablet (325 mg total) by mouth daily.  . [DISCONTINUED] traMADol (ULTRAM) 50 MG tablet Take 2 tablets (100 mg total) by mouth every 6 (six) hours as needed for moderate pain or severe pain.  . [DISCONTINUED] traZODone (DESYREL) 100 MG tablet Take 50 mg by mouth at bedtime.  . Na Sulfate-K Sulfate-Mg Sulf SOLN Take 1 kit by mouth once.   No facility-administered encounter medications on file as of 04/24/2015.     REVIEW OF SYSTEMS  : All other  systems reviewed and negative except where noted in the History of Present Illness.   PHYSICAL EXAM: BP 118/72 mmHg  Pulse 72  Ht '5\' 9"'  (1.753 m)  Wt 204 lb 2 oz (92.59 kg)  BMI 30.13 kg/m2 General: Well developed white male in no acute distress; tearful. Head: Normocephalic and atraumatic Eyes:  Sclerae anicteric, conjunctiva pink. Ears: Normal auditory acuity Lungs: Clear throughout to auscultation Heart: Regular rate and rhythm. Abdomen: Soft, non-distended.  Normal bowel sounds.  LUQ TTP. Rectal:  Will be done at the time of colonoscopy. Musculoskeletal: Symmetrical with no gross deformities  Skin: No lesions on visible extremities Extremities: No edema  Neurological: Alert oriented x 4, grossly non-focal Psychological:  Alert and cooperative. Normal mood and affect  ASSESSMENT AND PLAN: -LUQ abdominal pain:  ? Ulcer disease or if this is even GI related vs musculoskeletal, etc.  CT scan chest, abdomen, and pelvis unremarkable.  Will schedule for EGD.  Continue pantoprazole 40 mg BID for now. -Rectal bleeding:  Has seen blood with clots in his stool in the past, but nothing recent.  We'll schedule for colonoscopy as well.  *The risks, benefits, and alternatives to EGD and colonoscopy were discussed with the patient and he consents to proceed.    CC:  No ref. provider found  Addendum: Reviewed and agree with initial management. Jerene Bears, MD

## 2015-04-24 NOTE — Patient Instructions (Signed)
Continue Pantoprazole 40 mg twice a day  You have been scheduled for an endoscopy and colonoscopy. Please follow the written instructions given to you at your visit today. Please pick up your prep supplies at the pharmacy within the next 1-3 days. If you use inhalers (even only as needed), please bring them with you on the day of your procedure. Your physician has requested that you go to www.startemmi.com and enter the access code given to you at your visit today. This web site gives a general overview about your procedure. However, you should still follow specific instructions given to you by our office regarding your preparation for the procedure.

## 2015-04-26 NOTE — Addendum Note (Signed)
Addended by: Jerene Bears on: 04/26/2015 08:49 PM   Modules accepted: Level of Service

## 2015-04-27 ENCOUNTER — Telehealth: Payer: Self-pay | Admitting: Internal Medicine

## 2015-04-27 NOTE — Telephone Encounter (Signed)
Pt called to ask new time for 2nd dose of prep. Advised pt to take 2nd dose at 5:00am. Pt okay with new times. Alwin Lanigan-PV

## 2015-04-28 ENCOUNTER — Encounter: Payer: Self-pay | Admitting: Internal Medicine

## 2015-04-28 ENCOUNTER — Ambulatory Visit (INDEPENDENT_AMBULATORY_CARE_PROVIDER_SITE_OTHER)
Admission: RE | Admit: 2015-04-28 | Discharge: 2015-04-28 | Disposition: A | Discharge status: Home / Self Care | Payer: Medicaid Other | Source: Ambulatory Visit | Attending: Internal Medicine | Admitting: Internal Medicine

## 2015-04-28 ENCOUNTER — Encounter: Payer: Medicaid Other | Admitting: Internal Medicine

## 2015-04-28 ENCOUNTER — Ambulatory Visit (AMBULATORY_SURGERY_CENTER): Payer: Medicaid Other | Admitting: Internal Medicine

## 2015-04-28 ENCOUNTER — Other Ambulatory Visit: Payer: Self-pay | Admitting: Internal Medicine

## 2015-04-28 VITALS — BP 119/80 | HR 59 | Temp 97.4°F | Resp 15 | Ht 69.0 in | Wt 204.0 lb

## 2015-04-28 DIAGNOSIS — K2970 Gastritis, unspecified, without bleeding: Secondary | ICD-10-CM

## 2015-04-28 DIAGNOSIS — D125 Benign neoplasm of sigmoid colon: Secondary | ICD-10-CM

## 2015-04-28 DIAGNOSIS — D122 Benign neoplasm of ascending colon: Secondary | ICD-10-CM

## 2015-04-28 DIAGNOSIS — D123 Benign neoplasm of transverse colon: Secondary | ICD-10-CM | POA: Diagnosis not present

## 2015-04-28 DIAGNOSIS — K625 Hemorrhage of anus and rectum: Secondary | ICD-10-CM | POA: Diagnosis not present

## 2015-04-28 DIAGNOSIS — R109 Unspecified abdominal pain: Secondary | ICD-10-CM

## 2015-04-28 DIAGNOSIS — K5730 Diverticulosis of large intestine without perforation or abscess without bleeding: Secondary | ICD-10-CM | POA: Diagnosis not present

## 2015-04-28 DIAGNOSIS — K2950 Unspecified chronic gastritis without bleeding: Secondary | ICD-10-CM | POA: Diagnosis not present

## 2015-04-28 DIAGNOSIS — K2990 Gastroduodenitis, unspecified, without bleeding: Secondary | ICD-10-CM

## 2015-04-28 DIAGNOSIS — R1012 Left upper quadrant pain: Secondary | ICD-10-CM

## 2015-04-28 DIAGNOSIS — D124 Benign neoplasm of descending colon: Secondary | ICD-10-CM

## 2015-04-28 MED ORDER — HYDROCODONE-ACETAMINOPHEN 5-325 MG PO TABS: ORAL_TABLET | ORAL | Status: AC

## 2015-04-28 MED ORDER — SODIUM CHLORIDE 0.9 % IV SOLN: 500.0000 mL | INTRAVENOUS | Status: DC

## 2015-04-28 MED ORDER — CIPROFLOXACIN HCL 500 MG PO TABS: ORAL_TABLET | ORAL | Status: AC

## 2015-04-28 MED ORDER — METRONIDAZOLE 250 MG PO TABS: 250.0000 mg | ORAL_TABLET | Freq: Three times a day (TID) | ORAL | Status: AC

## 2015-04-28 NOTE — Progress Notes (Signed)
1112- pt. Waking up a little more,expelling large amount of air and abdomen soft and non distended. Pt. Began to c/o abdominal and pain to side. Pain level of 10,when questioned pt. He stated that he has had this pain and came in  with this pain. Dr. Hilarie Fredrickson in and informed of pt. C/o pain and in to evaluate. New orders given for anitibotics and pain medications.

## 2015-04-28 NOTE — Progress Notes (Signed)
Called to room to assist during endoscopic procedure.  Patient ID and intended procedure confirmed with present staff. Received instructions for my participation in the procedure from the performing physician.  

## 2015-04-28 NOTE — Progress Notes (Signed)
Report to PACU, RN, vss, BBS= Clear.  

## 2015-04-28 NOTE — Op Note (Signed)
[place]  [address]. [place], [address]   COLONOSCOPY PROCEDURE REPORT  PATIENT: Richard, Soto  MR#: [id number] BIRTHDATE: [birth year] , 49  yrs. old GENDER: male ENDOSCOPIST: Jerene Bears, MD PROCEDURE DATE:  04/28/2015 PROCEDURE:   Colonoscopy, diagnostic, Colonoscopy with snare polypectomy, and Colonoscopy with biopsy First Screening Colonoscopy - Avg.  risk and is 50 yrs.  old or older - No.  Prior Negative Screening - Now for repeat screening. N/A  History of Adenoma - Now for follow-up colonoscopy & has been > or = to 3 yrs.  N/A  Polyps removed today? Yes ASA CLASS:   Class III INDICATIONS:rectal bleeding and left-sided abdominal pain. MEDICATIONS: Monitored anesthesia care, this was the total dose used for all procedures at this session, and Propofol 800 mg IV  DESCRIPTION OF PROCEDURE:   After the risks benefits and alternatives of the procedure were thoroughly explained, informed consent was obtained.  The digital rectal exam revealed no rectal mass.   The LB [id number]  endoscope was introduced through the anus and advanced to the cecum, which was identified by both the appendix and ileocecal valve. No adverse events experienced. The quality of the prep was good.  (Suprep was used)  The instrument was then slowly withdrawn as the colon was fully examined. Estimated blood loss is zero unless otherwise noted in this procedure report.      COLON FINDINGS: Two sessile polyps ranging between 3-38mm in size were found in the ascending colon.  Polypectomies were performed with a cold snare.  The resection was complete, the polyp tissue was completely retrieved and sent to histology.   Six sessile and semi-pedunculated polyps ranging from 5 to 8mm in size were found in the transverse colon.  Polypectomies were performed using snare cautery(3) and with a cold snare(3).  The resection was complete, the polyp tissue was completely retrieved and  sent to histology. A pedunculated polyp with broad stalk measuring 25 mm in size was found in the descending colon.  A polypectomy was performed using snare cautery.  The resection was complete, the polyp tissue was completely retrieved and sent to histology.   A sessile polyp measuring 5 mm in size was found in the sigmoid colon.  A polypectomy was performed with a cold snare.  The resection was complete, the polyp tissue was completely retrieved and sent to histology.   There was moderate diverticulosis noted in the transverse colon and left colon with associated petechiae and mild inflammatory changes, multiple biopsies obtained from the left colon and transverse colon with cold forceps.  Retroflexed views revealed no abnormalities. The time to cecum = 2.2 Withdrawal time = 35.0   The scope was withdrawn and the procedure completed. COMPLICATIONS: There were no immediate complications.  ENDOSCOPIC IMPRESSION: 1.   Two sessile polyps ranging between 3-28mm in size were found in the ascending colon; polypectomies were performed with a cold snare  2.   Six sessile in semi-pedunculated polyps ranging from 5 to 79mm in size were found in the transverse colon; polypectomies were performed using snare cautery and with a cold snare 3.   Pedunculated polyp was found in the descending colon; polypectomy was performed using snare cautery 4.   Sessile polyp was found in the sigmoid colon; polypectomy was performed with a cold snare 5.   There was moderate diverticulosis noted in the transverse colon and left colon; multiple biopsies  RECOMMENDATIONS: 1.  Hold Aspirin and all other NSAIDs for 3  weeks. 2.  Await pathology results 3.  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  Office follow-up  eSigned:  Jerene Bears, MD 04/28/2015 11:12 AM   cc: the patient   PATIENT NAME:  Richard, Soto MR#: [id number]

## 2015-04-28 NOTE — Patient Instructions (Signed)

## 2015-04-28 NOTE — Op Note (Signed)
[place]  [address]. [place], [address]   ENDOSCOPY PROCEDURE REPORT  PATIENT: Richard Soto, Richard Soto  MR#: [id number] BIRTHDATE: Sep 30, 1965 , 49  yrs. old GENDER: male ENDOSCOPIST: Jerene Bears, MD PROCEDURE DATE:  04/28/2015 PROCEDURE:  EGD, diagnostic and EGD w/ biopsy ASA CLASS:     Class III INDICATIONS:  abdominal pain in upper left quadrant. MEDICATIONS: Monitored anesthesia care and Propofol 300 mg IV TOPICAL ANESTHETIC: none  DESCRIPTION OF PROCEDURE: After the risks benefits and alternatives of the procedure were thoroughly explained, informed consent was obtained.  The LB [id number] endoscope was introduced through the mouth and advanced to the second portion of the duodenum , Without limitations.  The instrument was slowly withdrawn as the mucosa was fully examined.   ESOPHAGUS: The mucosa of the esophagus appeared normal.   Z-line regular at 40 cm.  STOMACH: Moderate gastritis (inflammation) characterized by erythema and edema was found in the gastric body and on the lesser curvature of the stomach.  Cold forcep biopsies were taken at the gastric body, antrum and angularis to evaluate for h.  pylori.  DUODENUM: The duodenal mucosa showed no abnormalities in the bulb and 2nd part of the duodenum.  Cold forcep biopsies were taken in the second portion.  Retroflexed views revealed no abnormalities.     The scope was then withdrawn from the patient and the procedure completed.  COMPLICATIONS: There were no immediate complications.  ENDOSCOPIC IMPRESSION: 1.   The mucosa of the esophagus appeared normal 2.   Gastritis (inflammation) was found in the gastric body and on the lesser curvature of the stomach; multiple biopsies 3.   The duodenal mucosa showed no abnormalities in the bulb and 2nd part of the duodenum cold forcep biopsies were taken in the second portion  RECOMMENDATIONS: 1.  Await biopsy results 2.  Proceed with a  Colonoscopy.   eSigned:  Jerene Bears, MD 04/28/2015 11:04 AM  CC: the patient

## 2015-05-08 ENCOUNTER — Other Ambulatory Visit: Payer: Self-pay

## 2015-05-08 MED ORDER — MESALAMINE 1.2 G PO TBEC: 4.8000 g | DELAYED_RELEASE_TABLET | Freq: Every day | ORAL | Status: DC

## 2015-05-11 ENCOUNTER — Other Ambulatory Visit: Payer: Self-pay

## 2015-05-14 ENCOUNTER — Ambulatory Visit (INDEPENDENT_AMBULATORY_CARE_PROVIDER_SITE_OTHER): Payer: Medicaid Other | Admitting: Neurology

## 2015-05-14 ENCOUNTER — Encounter: Payer: Self-pay | Admitting: Neurology

## 2015-05-14 VITALS — BP 120/72 | HR 60 | Ht 68.0 in | Wt 199.0 lb

## 2015-05-14 DIAGNOSIS — G441 Vascular headache, not elsewhere classified: Secondary | ICD-10-CM | POA: Diagnosis not present

## 2015-05-14 DIAGNOSIS — D68312 Antiphospholipid antibody with hemorrhagic disorder: Secondary | ICD-10-CM | POA: Diagnosis not present

## 2015-05-14 DIAGNOSIS — D6862 Lupus anticoagulant syndrome: Secondary | ICD-10-CM

## 2015-05-14 MED ORDER — TOPIRAMATE 25 MG PO TABS: 25.0000 mg | ORAL_TABLET | Freq: Two times a day (BID) | ORAL | Status: AC

## 2015-05-14 NOTE — Progress Notes (Signed)
[facility] [address] [place]. [place] [address] [phone]       OFFICE FOLLOW-UP NOTE  Richard Soto Date of Birth:  [birth year] Medical Record Number:  [id number]   HPI: 50 year Caucasian male seen today for first office follow-up visit following hospital admission for suspected stroke in December 2016. Richard Soto is an 50 y.o. male with a history of hypertension, pulmonary embolus and hyperlipidemia, brought to the emergency room following onset of weakness and numbness involving his left side as well as slurred speech. He was last known well at 6 PM on 02/24/2015. He has no previous history of stroke nor TIA. Anticoagulation was discontinued one month prior to admission. He was not on antiplatelet therapy. CT scan of his head showed increased density of distal right M1 MCA. There was also early signs of possible ischemic changes in right MCA territory. NIH stroke score was 15. Blood pressure was elevated requiring intervention with intravenous labetalol. Patient was beyond time window for treatment consideration with IV TPA. Interventional radiology was consulted and patient underwent catheter cerebral angiogram. Study showed no occlusion or severe stenosis involving right ICA nor MCA. Patient was transferred to neurology intensive care unit for further management. LSN: 6 PM on 02/24/2015 tPA Given: No: Beyond time under for treatment consideration  MRI scan of the brain showed no acute infarct. Patient's neurological exam was found to be inconsistent and felt to be of nonorganic etiology. Transthoracic echocardiogram was unremarkable. LDL cholesterol was 83 mg percent and hemoglobin A1c was 5.8. He made gradual improvement. He was discharged home. He had extensive lab work done for hypercoagulable panel all of which was negative except for positive lupus anticoagulant. Patient states his done well and made significant improvement. He has cut back smoking and in fact  quit drinking alcohol. His blood pressure is well controlled today it is 120/72. Is tolerating aspirin well without bleeding or bruising. Patient has a new complaint today of her headaches for the last 1 week. He describes this as temporal headaches which is sharp and shooting in quality they may last for a few hours. Occurs several times a day. He describes them as being severe 8/10 in severity. He denies any prior history of migraine headaches of similar headaches. He has not taken any specific medications for headache but he does take hydrocodone daily for his knee pain. He has trouble walking due to chronic knee problems. He denies any vision symptoms with her headaches or focal neurological symptoms. He has not had any brain imaging study done. ROS:   14 system review of systems is positive for  headache, abdominal pain, blurred vision, incontinence of bowels, joint pain and swelling, walking difficulty, weakness, nervousness anxiety and all other systems negative  PMH:  Past Medical History  Diagnosis Date  . Pulmonary embolism ([place])   . Stroke ([place])   . Hypertension     Social History:  Social History   Social History  . Marital Status: Legally Separated    Spouse Name: N/A  . Number of Children: 0  . Years of Education: N/A   Occupational History  . retired    Social History Main Topics  . Smoking status: Current Some Day Smoker -- 0.50 packs/day    Types: Cigarettes  . Smokeless tobacco: Never Used     Comment: 5 cigarettes per day   . Alcohol Use: No     Comment: 6 pack a week, pt quit drinking  . Drug Use: No  .  Sexual Activity: Not on file   Other Topics Concern  . Not on file   Social History Narrative    Medications:   Current Outpatient Prescriptions on File Prior to Visit  Medication Sig Dispense Refill  . amLODipine (NORVASC) 10 MG tablet Take 1 tablet (10 mg total) by mouth daily. 30 tablet 1  . atorvastatin (LIPITOR) 20 MG tablet Take 1 tablet (20 mg  total) by mouth daily at 6 PM. 30 tablet 1  . folic acid (FOLVITE) 1 MG tablet Take 1 tablet (1 mg total) by mouth daily.    . Multiple Vitamin (MULTIVITAMIN) tablet Take 1 tablet by mouth daily. Reported on 04/28/2015    . tamsulosin (FLOMAX) 0.4 MG CAPS capsule Take 1 capsule (0.4 mg total) by mouth daily after breakfast. 30 capsule 1   No current facility-administered medications on file prior to visit.    Allergies:   Allergies  Allergen Reactions  . Fentanyl Other (See Comments)    Paradoxical response    Physical Exam General: well developed, well nourished middle-age Caucasian male, seated, in no evident distress Head: head normocephalic and atraumatic.  Neck: supple with no carotid or supraclavicular bruits Cardiovascular: regular rate and rhythm, no murmurs Musculoskeletal: no deformity Skin:  no rash/petichiae Vascular:  Normal pulses all extremities Filed Vitals:   05/14/15 0918  BP: 120/72  Pulse: 60   Neurologic Exam Mental Status: Awake and fully alert. Oriented to place and time. Recent and remote memory intact. Attention span, concentration and fund of knowledge appropriate. Mood and affect appropriate.  Cranial Nerves: Fundoscopic exam reveals sharp disc margins. Pupils equal, briskly reactive to light. Extraocular movements full without nystagmus. Visual fields full to confrontation. Hearing intact. Facial sensation intact. Face, tongue, palate moves normally and symmetrically.  Motor: Normal bulk and tone. Normal strength in all tested extremity muscles. Sensory.: intact to touch ,pinprick .position and vibratory sensation.  Coordination: Rapid alternating movements normal in all extremities. Finger-to-nose and heel-to-shin performed accurately bilaterally. Gait and Station: Arises from chair without difficulty. Stance is Leaning to the right favoring the right knee. Walks with right knee externally rotated and favoring. Not able to heel, toe and tandem walk  without difficulty.  Reflexes: 1+ and symmetric. Toes downgoing.       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 Positive lupus anticoagulant on lab work unclear if this is clinically relevant but since he has remote history of pulmonary embolism issue whether he needs to be on long-term anticoagulation needs to be decided upon.    PLAN: I had a long discussion with the patient with his new onset of temporal headaches which are vascular in nature but unusual to have onset late in life and she will check MRI scan of the brain with and without contrast to look for underlying structural lesion. Start Topamax 25 mg twice daily for headache. Referred to hematology for his positive lupus anticoagulant for further workup or changing to anticoagulation given prior history of pulmonary embolism. .Continue aspirin for now and maintain strict control of hypertension with blood pressure goal below 130/90 and I complimented him on  Decreasing smoking  and quitting alcohol intake.Greater than 50% of time during this 25 minute visit was spent on counseling,explanation of diagnosis, planning of further management, discussion with patient and family and coordination of care  He'll return for follow-up with nurse practitioner in 3 months or call earlier if necessary Antony Contras, MD   Note: This document was prepared with digital dictation and possible smart phrase technology. Any transcriptional errors that result from this process are unintentional

## 2015-05-14 NOTE — Patient Instructions (Addendum)
I had a long discussion with the patient with his new onset of temporal headaches which are vascular in nature but unusual to have onset late in life and she will check MRI scan of the brain with and without contrast to look for underlying structural lesion. Start Topamax 25 mg twice daily for headache. Referred to hematology for his positive lupus anticoagulant for further workup or changing to anticoagulation .Continue aspirin and maintain strict control of hypertension with blood pressure goal below 130/90 and I complimented him on  Decreasing smoking  and quitting alcohol intake. He'll return for follow-up withpractitioner in 3 months or call earlier if necessary

## 2015-05-15 ENCOUNTER — Encounter: Payer: Self-pay | Admitting: *Deleted

## 2015-05-15 ENCOUNTER — Telehealth: Payer: Self-pay | Admitting: *Deleted

## 2015-05-15 NOTE — Telephone Encounter (Signed)
I have received a prior authorization request for patient's Lialda (phone to [facility] [phone]). I spoke to Abigail Butts (interaction [id number]) to answer prior authorization questions. 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 She states this will go for review and we can call back in 24 hours for determination.

## 2015-05-18 ENCOUNTER — Telehealth: Payer: Self-pay | Admitting: Hematology

## 2015-05-18 ENCOUNTER — Encounter: Payer: Self-pay | Admitting: Hematology

## 2015-05-18 NOTE — Telephone Encounter (Signed)
New Patient Ref sent to Dr. Leonie Man, New Pt Packet sent to [facility]

## 2015-05-19 NOTE — Telephone Encounter (Signed)
I have spoken to [facility] @ patient's insurance and she states that Lialda was approved from 05/15/15 thru 05/09/16. Interaction [id number].

## 2015-05-26 ENCOUNTER — Other Ambulatory Visit: Payer: Self-pay | Admitting: Neurology

## 2015-05-26 ENCOUNTER — Ambulatory Visit
Admission: RE | Admit: 2015-05-26 | Discharge: 2015-05-26 | Disposition: A | Discharge status: Home / Self Care | Payer: Medicaid Other | Source: Ambulatory Visit | Attending: Neurology | Admitting: Neurology

## 2015-05-26 DIAGNOSIS — Z77018 Contact with and (suspected) exposure to other hazardous metals: Secondary | ICD-10-CM

## 2015-05-26 DIAGNOSIS — G441 Vascular headache, not elsewhere classified: Secondary | ICD-10-CM

## 2015-05-26 MED ORDER — GADOBENATE DIMEGLUMINE 529 MG/ML IV SOLN
19.0000 mL | Freq: Once | INTRAVENOUS | Status: AC | PRN
  Administered 2015-05-26: 19 mL via INTRAVENOUS

## 2015-06-02 ENCOUNTER — Encounter: Payer: Self-pay | Admitting: *Deleted

## 2015-06-04 ENCOUNTER — Ambulatory Visit (HOSPITAL_BASED_OUTPATIENT_CLINIC_OR_DEPARTMENT_OTHER): Payer: Medicaid Other | Admitting: Hematology

## 2015-06-04 ENCOUNTER — Ambulatory Visit (HOSPITAL_BASED_OUTPATIENT_CLINIC_OR_DEPARTMENT_OTHER): Payer: Medicaid Other

## 2015-06-04 ENCOUNTER — Encounter: Payer: Self-pay | Admitting: Hematology

## 2015-06-04 ENCOUNTER — Telehealth: Payer: Self-pay | Admitting: Hematology

## 2015-06-04 VITALS — BP 138/62 | HR 64 | Temp 98.2°F | Resp 18 | Ht 68.0 in | Wt 198.6 lb

## 2015-06-04 DIAGNOSIS — I2699 Other pulmonary embolism without acute cor pulmonale: Secondary | ICD-10-CM | POA: Diagnosis not present

## 2015-06-04 DIAGNOSIS — Z801 Family history of malignant neoplasm of trachea, bronchus and lung: Secondary | ICD-10-CM

## 2015-06-04 DIAGNOSIS — D6862 Lupus anticoagulant syndrome: Secondary | ICD-10-CM

## 2015-06-04 DIAGNOSIS — I63311 Cerebral infarction due to thrombosis of right middle cerebral artery: Secondary | ICD-10-CM

## 2015-06-04 DIAGNOSIS — Z720 Tobacco use: Secondary | ICD-10-CM

## 2015-06-04 LAB — CBC & DIFF AND RETIC
BASO%: 0.5 % (ref 0.0–2.0)
BASOS ABS: 0 10*3/uL (ref 0.0–0.1)
EOS ABS: 0.3 10*3/uL (ref 0.0–0.5)
EOS%: 4.5 % (ref 0.0–7.0)
HCT: 39.6 % (ref 38.4–49.9)
HEMOGLOBIN: 13.4 g/dL (ref 13.0–17.1)
IMMATURE RETIC FRACT: 2 % — AB (ref 3.00–10.60)
LYMPH%: 33 % (ref 14.0–49.0)
MCH: 30.8 pg (ref 27.2–33.4)
MCHC: 33.8 g/dL (ref 32.0–36.0)
MCV: 91 fL (ref 79.3–98.0)
MONO#: 0.5 10*3/uL (ref 0.1–0.9)
MONO%: 7.7 % (ref 0.0–14.0)
NEUT%: 54.3 % (ref 39.0–75.0)
NEUTROS ABS: 3.5 10*3/uL (ref 1.5–6.5)
Platelets: 296 10*3/uL (ref 140–400)
RBC: 4.35 10*6/uL (ref 4.20–5.82)
RDW: 12.9 % (ref 11.0–14.6)
Retic %: 0.81 % (ref 0.80–1.80)
Retic Ct Abs: 35.24 10*3/uL (ref 34.80–93.90)
WBC: 6.5 10*3/uL (ref 4.0–10.3)
lymph#: 2.1 10*3/uL (ref 0.9–3.3)

## 2015-06-04 LAB — COMPREHENSIVE METABOLIC PANEL
ALBUMIN: 3.9 g/dL (ref 3.5–5.0)
ALK PHOS: 132 U/L (ref 40–150)
ALT: 27 U/L (ref 0–55)
AST: 18 U/L (ref 5–34)
Anion Gap: 8 mEq/L (ref 3–11)
BUN: 10.4 mg/dL (ref 7.0–26.0)
CO2: 25 mEq/L (ref 22–29)
Calcium: 9.9 mg/dL (ref 8.4–10.4)
Chloride: 105 mEq/L (ref 98–109)
Creatinine: 0.8 mg/dL (ref 0.7–1.3)
GLUCOSE: 123 mg/dL (ref 70–140)
Potassium: 4 mEq/L (ref 3.5–5.1)
SODIUM: 138 meq/L (ref 136–145)
TOTAL PROTEIN: 7.5 g/dL (ref 6.4–8.3)

## 2015-06-04 NOTE — Progress Notes (Signed)
Marland Kitchen    HEMATOLOGY/ONCOLOGY CONSULTATION NOTE  Date of Service: 06/04/2015  Patient Care Team: Lacie Draft, NP as PCP - General (Family Medicine) Neurology: Dr Antony Contras MD  [place]:  Positive lupus anticoagulant  HISTORY OF PRESENTING ILLNESS:   Richard Soto is a wonderful 50 y.o. male who has been referred to [place] by Dr Antony Contras for evaluation and management of a positive lupus anticoagulant test.  Patient has a history of hypertension  And had a CTA of the chest done at [facility] on 12/10/2014 for shortness of breath that showed pulmonary emboli in the right lower lobe segmental and left lower lobe subsegmental pulmonary arteries.  He was also noted to have a 1 cm left upper paratracheal, lower paraesophageal and portacaval lymph nodes that were considered indeterminate. Patient notes he was started on Xarelto for anticoagulation. He also had some hematochezia and left lower abdominal pain and had a CT of the abdomen that showed a short segment of narrowing at the proximal transverse colon.  Scattered para-aortic and retroperitoneal lymph nodes. Hepatic steatosis.  Patient notes that he was on anticoagulation for about one month with Xarelto. He had another CTA of the chest on 01/13/2015 for chest pain that was negative for acute pulmonary embolism.  Patient notes that he has been off anticoagulation since that time. He has had a few CTA of the chest since then which have not shown any signs of pulmonary embolism.  Patient presented on 02/25/2016 with left-sided weakness and numbness as well as slurred speech.  He has been off anticoagulation for about a month prior to this.  Initial CT scan showed early signs of possible ischemic change in the right MCA territory.  Patient had a catheter cerebral angiogram that showed no confusion or severe stenosis.  MRI of the brain showed no evidence of acute infarction.  Transthoracic echo was unremarkable.  He  had extensive labs done by neurology and put in a hypercoagulable panel which was negative except for borderline positive lupus anticoagulant.  Patient has since had an EGD and colonoscopy- it showed gastritis and several colonic polyps. Was also noted to have segmental colitis for which he was started on Lialda.  Patient was referred to neurology to evaluate his positive lupus anticoagulant.  MEDICAL HISTORY:  Past Medical History  Diagnosis Date  . Pulmonary embolism ([place])   . Stroke ([place])   . Hypertension   . 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 Hepatic steatosis   . Tubular adenoma of colon 04/2015    multiple    SURGICAL HISTORY: Past Surgical History  Procedure Laterality Date  . Replacement total knee    . Radiology with anesthesia N/A 02/24/2015    Procedure: RADIOLOGY WITH ANESTHESIA;  Surgeon: Medication Radiologist, MD;  Location: [facility];  Service: Radiology;  Laterality: N/A;  . Cholecystectomy  2016    SOCIAL HISTORY: Social History   Social History  . Marital Status: Legally Separated    Spouse Name: N/A  . Number of Children: 0  . Years of Education: N/A   Occupational History  . retired    Social History Main Topics  . Smoking status: Current Some Day Smoker -- 0.50 packs/day    Types: Cigarettes  . Smokeless tobacco: Never Used     Comment: 5 cigarettes per day   . Alcohol Use: No     Comment: 6 pack a week, pt quit drinking  . Drug Use: No  . Sexual Activity:  Not on file   Other Topics Concern  . Not on file   Social History Narrative  ETOH abuse  FAMILY HISTORY: Family History  Problem Relation Age of Onset  . Lung cancer Father   . Diabetes Mother     ALLERGIES:  is allergic to fentanyl.  MEDICATIONS:  Current Outpatient Prescriptions  Medication Sig Dispense Refill  . amLODipine (NORVASC) 10 MG tablet Take 1 tablet (10 mg total) by mouth daily. 30 tablet 1  . aspirin 81 MG tablet Take 81 mg by mouth daily.    Marland Kitchen atorvastatin (LIPITOR) 20  MG tablet Take 1 tablet (20 mg total) by mouth daily at 6 PM. 30 tablet 1  . citalopram (CELEXA) 20 MG tablet Take 20 mg by mouth.    . mesalamine (LIALDA) 1.2 g EC tablet Take 1,200 mg by mouth.    . Multiple Vitamin (MULTIVITAMIN) tablet Take 1 tablet by mouth daily. Reported on 04/28/2015    . oxyCODONE (ROXICODONE) 5 MG immediate release tablet Take 5 mg by mouth.    . tamsulosin (FLOMAX) 0.4 MG CAPS capsule Take 1 capsule (0.4 mg total) by mouth daily after breakfast. 30 capsule 1  . topiramate (TOPAMAX) 25 MG tablet Take 1 tablet (25 mg total) by mouth 2 (two) times daily. 120 tablet 3  . Vitamin D, Ergocalciferol, (DRISDOL) 50000 units CAPS capsule Take by mouth.     No current facility-administered medications for this visit.    REVIEW OF SYSTEMS:    10 Point review of Systems was done is negative except as noted above.  PHYSICAL EXAMINATION: ECOG PERFORMANCE STATUS: 1 - Symptomatic but completely ambulatory  . Filed Vitals:   06/04/15 1409  BP: 138/62  Pulse: 64  Temp: 98.2 F (36.8 C)  Resp: 18   Filed Weights   06/04/15 1409  Weight: 198 lb 9.6 oz (90.084 kg)   .Body mass index is 30.2 kg/(m^2).  GENERAL:alert, in no acute distress and comfortable SKIN: skin color, texture, turgor are normal, no rashes or significant lesions EYES: normal, conjunctiva are pink and non-injected, sclera clear OROPHARYNX:no exudate, no erythema and lips, buccal mucosa, and tongue normal  NECK: supple, no JVD, thyroid normal size, non-tender, without nodularity LYMPH:  no palpable lymphadenopathy in the cervical, axillary or inguinal LUNGS: clear to auscultation with normal respiratory effort HEART: regular rate & rhythm,  no murmurs and no lower extremity edema ABDOMEN: abdomen soft, non-tender, normoactive bowel sounds  Musculoskeletal: no cyanosis of digits and no clubbing  PSYCH: alert & oriented x 3 with fluent speech NEURO: no focal motor/sensory deficits  LABORATORY DATA:    I have reviewed the data as listed  . CBC Latest Ref Rng 04/17/2015 03/01/2015 02/25/2015  WBC 4.0 - 10.5 K/uL 11.1(H) 6.9 11.0(H)  Hemoglobin 13.0 - 17.0 g/dL 15.4 14.3 13.9  Hematocrit 39.0 - 52.0 % 44.5 42.6 41.9  Platelets 150 - 400 K/uL 189 216 221   . CBC    Component Value Date/Time   WBC 11.1* 04/17/2015 1557   RBC 4.83 04/17/2015 1557   HGB 15.4 04/17/2015 1557   HCT 44.5 04/17/2015 1557   PLT 189 04/17/2015 1557   MCV 92.1 04/17/2015 1557   MCH 31.9 04/17/2015 1557   MCHC 34.6 04/17/2015 1557   RDW 12.0 04/17/2015 1557   LYMPHSABS 3.7 02/24/2015 2233   MONOABS 0.6 02/24/2015 2233   EOSABS 0.2 02/24/2015 2233   BASOSABS 0.0 02/24/2015 2233     . CMP Latest Ref Rng 04/17/2015  03/01/2015 02/25/2015  Glucose 65 - 99 mg/dL 120(H) 99 101(H)  BUN 6 - 20 mg/dL 14 11 6   Creatinine 0.61 - 1.24 mg/dL 0.61 0.65 0.87  Sodium 135 - 145 mmol/L 136 137 141  Potassium 3.5 - 5.1 mmol/L 4.0 4.2 4.3  Chloride 101 - 111 mmol/L 100(L) 103 111  CO2 22 - 32 mmol/L 24 25 17(L)  Calcium 8.9 - 10.3 mg/dL 10.1 9.9 8.6(L)  Total Protein 6.5 - 8.1 g/dL 6.5 - -  Total Bilirubin 0.3 - 1.2 mg/dL 1.0 - -  Alkaline Phos 38 - 126 U/L 115 - -  AST 15 - 41 U/L 19 - -  ALT 17 - 63 U/L 23 - -   Component     Latest Ref Rng 02/28/2015 06/04/2015  PTT Lupus Anticoagulant     0.0 - 40.6 sec 50.5 (H)   DRVVT     0.0 - 44.0 sec 44.8 (H) 36.7  Lupus Anticoag Interp      Comment: Comment:  Beta-2 Glycoprotein I Ab, IgG     0 - 20 GPI IgG units <9 <9  Beta-2-Glycoprotein I IgM     0 - 32 GPI IgM units <9   Beta-2-Glycoprotein I IgA     0 - 25 GPI IgA units <9   Anticardiolipin Ab,IgG,Qn     0 - 14 GPL U/mL <9 <9  Anticardiolipin Ab,IgM,Qn     0 - 12 MPL U/mL <9 <9  Anticardiolipin Ab,IgA,Qn     0 - 11 APL U/mL 17 (H) 73 (H)  PTT-LA     0.0 - 43.6 sec  41.3  Beta-2 Glyco 1 IgA     0 - 25 GPI IgA units  <9  Beta-2 Glyco 1 IgM     0 - 32 GPI IgM units  <9  Recommendations-F5LEID:       Comment   Comment      Comment   Recommendations-PTGENE:      Comment   Additional Information      Comment   Antithrombin Activity     75 - 120 % 112   Protein C-Functional     73 - 180 % 142   Protein C, Total     60 - 150 % 121   Protein S-Functional     63 - 140 % 129   Protein S, Total     60 - 150 % 132   Homocysteine     0.0 - 15.0 umol/L 13.7   PTT-LA Mix     0.0 - 40.6 sec 48.2 (H)   Hexagonal Phase Phospholipid     0 - 11 sec 12 (H)          RADIOGRAPHIC STUDIES: I have personally reviewed the radiological images as listed and agreed with the findings in the report. Dg Eye Foreign Body  05/26/2015  CLINICAL DATA:  Metal working/exposure; clearance prior to MRI EXAM: ORBITS FOR FOREIGN BODY - 2 VIEW COMPARISON:  None. FINDINGS: There is no evidence of metallic foreign body within the orbits. No significant bone abnormality identified. IMPRESSION: No evidence of metallic foreign body within the orbits. Electronically Signed   By: Inez Catalina M.D.   On: 05/26/2015 10:23   Mr Jeri Cos [id number] Contrast  05/27/2015   [facility] [address], [place], [place] [address] [phone] NEUROIMAGING REPORT STUDY DATE: 05/26/2015 PATIENT NAME: Richard Soto DOB: [birth year] MRN: [id number] ORDERING CLINICIAN: Dr Leonie Man CLINICAL HISTORY:  49 year  patient with vascular headaches COMPARISON FILMS: MRI Brain w/wo 02/25/2015 EXAM: MRI brain with and without contrast TECHNIQUE: Sagittal T1, axial T1, T2, gradient echo, diffusion-weighted imaging, ADC map, coronal T2 and postcontrast axial and coronal T1 images is obtained on a 1.5 Tesla magnet. CONTRAST: 19 ML IV MultiHance IMAGING SITE: [facility] FINDINGS: The brain parenchyma shows no significant abnormalities. No structural lesion, tumor or infarcts are noted. Subarachnoid spaces and ventricular system appear normal. 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 Orbits appear unremarkable. Paranasal sinuses show mild chronic inflammatory changes. Pituitary gland and cerebellar tonsils appear normal. Flow voids of the large vessels of the intracranial circulation appear to be patent. Calvarium and extra-axial brain structures appear normal.   05/27/2015   Unremarkable MRI scan of the brain with and without contrast. Stable tiny benign pineal region cyst. No significant change compared with MRI dated 02/25/2015 INTERPRETING PHYSICIAN: PRAMOD SETHI, MD Certified in  Neuroimaging by [place] [facility] Neuroimaging and [place] for Neurological Subspecialities   EGD done on 04/28/2015  Colonoscopy done on 04/28/2015 ENDOSCOPIC IMPRESSION: 1. Two sessile polyps ranging between 3-49mm in size were found in the ascending colon; polypectomies were performed with a cold snare 2. Six sessile in semi-pedunculated polyps ranging from 5 to 40mm in size were found in the transverse colon; polypectomies were performed using snare cautery and with a cold snare 3. Pedunculated polyp was found in the descending colon; polypectomy was performed using snare cautery 4. Sessile polyp was found in the sigmoid colon; polypectomy was performed with a cold snare 5. There was moderate diverticulosis noted in the transverse colon and left colon; multiple biopsies RECOMMENDATIONS: 1. Hold Aspirin and all other NSAIDs for 3 weeks. 2. Await pathology results 3. 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 Office follow-up  CLINICAL DATA:  50 year old male with recent pulmonary embolus and right knee pain. Evaluate for DVT.  EXAM: RIGHT LOWER EXTREMITY VENOUS DOPPLER ULTRASOUND  TECHNIQUE: Gray-scale sonography with graded compression, as well as color Doppler and duplex ultrasound were performed to evaluate the lower extremity deep venous systems from the level of the common femoral vein and  including the common femoral, femoral, profunda femoral, popliteal and calf veins including the posterior tibial, peroneal and gastrocnemius veins when visible. The superficial great saphenous vein was also interrogated. Spectral Doppler was utilized to evaluate flow at rest and with distal augmentation maneuvers in the common femoral, femoral and popliteal veins.  COMPARISON:  None.  FINDINGS: Contralateral Common Femoral Vein: Respiratory phasicity is normal and symmetric with the symptomatic side. No evidence of thrombus. Normal compressibility.  Common Femoral Vein: No evidence of thrombus. Normal compressibility, respiratory phasicity and response to augmentation.  Saphenofemoral Junction: No evidence of thrombus. Normal compressibility and flow on color Doppler imaging.  Profunda Femoral Vein: No evidence of thrombus. Normal compressibility and flow on color Doppler imaging.  Femoral Vein: No evidence of thrombus. Normal compressibility, respiratory phasicity and response to augmentation.  Popliteal Vein: No evidence of thrombus. Normal compressibility, respiratory phasicity and response to augmentation.  Calf Veins: No evidence of thrombus. Normal compressibility and flow on color Doppler imaging.  Superficial Great Saphenous Vein: No evidence of thrombus. Normal compressibility and flow on color Doppler imaging.  Venous Reflux:  None.  Other Findings:  None.  IMPRESSION: No evidence of deep venous thrombosis.   Electronically Signed   By: Myrle Sheng  Laurence Ferrari M.D.   On: 01/14/2015 15:01    CT ANGIOGRAPHY CHEST  CT ABDOMEN AND PELVIS WITH CONTRAST  TECHNIQUE: Multidetector CT imaging of the chest was performed using the standard protocol during bolus administration of intravenous contrast. Multiplanar CT image reconstructions and MIPs were obtained to evaluate the vascular anatomy. Multidetector CT imaging of the abdomen and pelvis was performed using the  standard protocol during bolus administration of intravenous contrast.  CONTRAST: 100 mL Omnipaque 350 intravenous  COMPARISON: 07/10/2014  FINDINGS: CTA CHEST FINDINGS  Cardiovascular: There is good opacification of the pulmonary arteries. There is no pulmonary embolism. The thoracic aorta is normal in caliber and intact.  Lungs: Clear  Central airways: Patent  Effusions: None  Lymphadenopathy: None  Esophagus: Unremarkable  Musculoskeletal: No significant lesion  CT ABDOMEN and PELVIS FINDINGS  Hepatobiliary: Cholecystectomy. Diffuse fatty infiltration of the liver. No bile duct dilatation  Pancreas: Normal  Spleen: Normal  Adrenals/Urinary Tract: The adrenals and kidneys are normal in appearance. There is no urinary calculus evident. There is no hydronephrosis or ureteral dilatation. Collecting systems and ureters appear unremarkable.  Stomach/Bowel: The stomach, small bowel and colon are remarkable only for mild uncomplicated colonic diverticulosis. Normal appendix.  Vascular/Lymphatic: The abdominal aorta is normal in caliber. There is mild atherosclerotic calcification. There is no adenopathy in the abdomen or pelvis.  Reproductive: Unremarkable  Other: No acute inflammatory changes are evident in the abdomen or pelvis. No ascites.  Musculoskeletal: Small fat containing left inguinal hernia. Small fat containing umbilical hernia. Moderate lumbar degenerative disc changes, greatest at L5-S1. Grade 1 spondylolisthesis at L5-S1. No significant skeletal lesions.  Review of the MIP images confirms the above findings.  IMPRESSION: 1. Negative for acute pulmonary embolism. No acute findings are evident in the chest. 2. No acute findings are evident in the abdomen or pelvis. 3. Diffuse fatty infiltration of the liver. 4. Small fat containing left inguinal hernia. Small fat containing umbilical hernia.   Electronically Signed By: Andreas Newport  M.D. On: 01/13/2015 21:53   EXAM: CT abdomen and pelvis with contrast DATE: 12/12/14 16:57:12 ACCESSION: [id number] UN DICTATED: 12/12/14 17:40:30 INTERPRETATION LOCATION: [place]  CLINICAL INDICATION: 50 Year Old (M): Left lower quadrant pain. Hemoccult positive stools.  COMPARISON: CTA chest dated 12/10/2014.  TECHNIQUE: A spiral CT scan was obtained with IV contrast from the lung bases to the pubic symphysis.  Images were reconstructed in the axial plane. Coronal and sagittal reformatted images were also provided for further evaluation. Oral contrast was administered.  FINDINGS:  Nonocclusive pulmonary embolus noted in the posterobasal right lower lobe pulmonary artery, as seen on recent CTA chest. There is trace atelectasis in the lung bases.   ABDOMEN and PELVIS: Diffuse hepatic steatosis. Heterogeneous attenuation of the left hepatic lobe (segment 3), is nonspecific. Gallbladder is surgically absent. No intrahepatic or extrahepatic biliary ductal dilatation.   Mild, diffuse fatty infiltration of the pancreas.   The spleen, adrenal glands and kidneys are unremarkable.  There is no evidence of bowel obstruction. Colonic diverticulosis without diverticulitis.  Short segment luminal narrowing at the proximal transverse colon likely represents focal peristalsis (2:33 and 4:83).  The appendix is normal.  Scattered prominent para-aortic/retroperitoneal nodes are nonspecific. A representative prominent 0.7 cm retroperitoneal node (2:43), is identified. Retroperitoneal nodule measuring 0.6 cm adjacent to the right psoas musculature (2:51) is nonspecific.   The urinary bladder and prostate are unremarkable.   Small fat containing left inguinal hernia.   Degenerative changes are seen throughout the spine. Minimal  grade 1 anterolisthesis of L5 on S1 is likely degenerative. No worrisome osseous abnormalities are seen.  IMPRESSION:  1.  Short segment narrowing at the proximal  transverse colon, may represent focal peristalsis. Underlying lesion cannot be excluded. Recommend colonoscopy for further evaluation.  2. Scattered prominent para-aortic/retroperitoneal nodes are nonspecific.   3. Hepatic steatosis.  4. Right lower lobe posterobasal pulmonary emboli, as seen on recent chest CTA.  Findings discussed by Dr. Lonia Farber with Dr. Doneta Public on 12/13/2014 at 1508 hrs.  EXAM: CTA Chest for Pulmonary Embolus DATE: 12/10/14 03:36:05 ACCESSION: [id number] UN DICTATED: 12/10/14 05:05:10 INTERPRETATION LOCATION: [place]  CLINICAL INDICATION: 50 Year Old (M): SOB    COMPARISON: Chest radiograph from the same day.  TECHNIQUE: A spiral CTA scan was obtained with IV contrast from the thoracic inlet through the hemidiaphragms. Images were reconstructed in the axial plane. Multiplanar reformatted and MIP images are provided for further evaluation of the pulmonary vasculature.  FINDINGS:  Normal heart size. No pericardial effusion. The great vessels are normal in caliber. Coronary artery calcifications.  Filling defects in right lower lobe segmental pulmonary artery and left lower lobe subsegmental pulmonary arteries.  1 cm left upper paratracheal lymph node (4:26). 1 cm lower paraesophageal lymph node (4:92). No enlarged hilar or axillary lymph nodes.  The central tracheobronchial tree is clear. No focal consolidative opacity. Trace atelectasis at the lung bases. Trace bilateral pleural effusions.  The liver is diffusely hypoattenuating, consistent with steatosis. The gallbladder is surgically absent. Fatty atrophy of the pancreas. 1 cm portacaval lymph node.  Degenerative changes in the bilateral glenohumeral joints and thoracic spine. No worrisome osseous lesion.  IMPRESSION: -- Pulmonary emboli in right lower lobe segmental and left lower lobe subsegmental pulmonary arteries. -- 1 cm left upper paratracheal, lower paraesophageal and portacaval lymph nodes. Findings  are indeterminate. Differential includes malignancy, such as esophageal. Correlate with patient history. Short interval follow-up recommended.  -- Hepatic steatosis.   The findings of the study were communicated by telephone to Dr. Rolinda Roan at 03:56 on Wednesday, December 10, 2014 by Dr. Sharlyn Bologna.    ASSESSMENT & PLAN:   50 year old male with history of smoking with  #1 bilateral pulmonary embolisms in September 2016 deficiency [facility] no definite provoking factor.   #2 TIA /right MCA stroke like symptoms in 02/2015 - resolved.   acquired risk from cigarette smoking. Factor V Leiden mutation and prothrombin gene mutation negative. Lupus anticoagulant was borderline positive in  02/2015 but is undetectable on repeat labs drawn on this visit. Noted to have  Moderately elevated IgA anticardiolipin antibody ?increased risk of VTE Patient also appears to have inflammatory bowel disease which would be an additional independent factor for VTE. (on treatment with Lialda)  Patient was suboptimally treated with only one month of Xarelto for his PE ? Related to hematochezia. Has been on ASA since TIA. No significant issues with bleeding at this time. Plan -elevated risk of recurrent VTE from IBD, smoking, IgA cardiolipin antibody (possibly). Lupus anticoag is now undetectable and does not appear to represent a persistent antibody. -had the least would recommend to continue daily aspirin for CVA and DVT prophylaxis. -it been quite reasonable to consider therapeutic anticoagulation on an ongoing basis with Xarelto or other alternative if there is not significant issues with GI bleeding from IBD. -continue f/u with PCP. -treatment any iron deficiency with GIB related to IBD aggressive since this can rev up F VIII levels and increase risk of VTE as well. -I counseled  the patient on the need for absolute smoking cessation, avoid dehydration and immobility.  RTC with PCP Dr Rosana Hoes and  neurology. RTC with Dr Irene Limbo on an as needed basis.  All of the patients questions were answered with apparent satisfaction. The patient knows to call the clinic with any problems, questions or concerns.  I spent 70 minutes counseling the patient face to face. The total time spent in the appointment was 80 minutes and more than 50% was on counseling and direct patient cares.    Sullivan Lone MD [place] AAHIVMS [facility] Hematology/Oncology Physician [facility]  (Office):       [phone] (Work cell):  [phone] (Fax):           [phone]  06/04/2015 2:28 PM

## 2015-06-04 NOTE — Telephone Encounter (Signed)
per pof to sch pt back to lab-no retun visit sch per pof as needed

## 2015-06-05 LAB — LUPUS ANTICOAGULANT PANEL
DRVVT: 36.7 s (ref 0.0–44.0)
PTT-LA: 41.3 s (ref 0.0–43.6)

## 2015-06-05 LAB — BETA-2-GLYCOPROTEIN I ABS, IGG/M/A
Beta-2 Glyco 1 IgM: <9 GPI IgM units (ref 0–32)
Beta-2 Glycoprotein I Ab, IgG: <9 GPI IgG units (ref 0–20)

## 2015-06-06 LAB — CARDIOLIPIN ANTIBODIES, IGG, IGM, IGA
ANTICARDIOLIPIN IGA: 73 U/mL — AB (ref 0–11)
Anticardiolipin Ab,IgG,Qn: <9 GPL U/mL (ref 0–14)
Anticardiolipin Ab,IgM,Qn: <9 MPL U/mL (ref 0–12)

## 2015-06-15 ENCOUNTER — Encounter: Payer: Self-pay | Admitting: Internal Medicine

## 2015-06-15 ENCOUNTER — Ambulatory Visit (INDEPENDENT_AMBULATORY_CARE_PROVIDER_SITE_OTHER): Payer: Medicaid Other | Admitting: Internal Medicine

## 2015-06-15 VITALS — BP 122/70 | HR 68 | Ht 68.0 in | Wt 195.4 lb

## 2015-06-15 DIAGNOSIS — K5150 Left sided colitis without complications: Secondary | ICD-10-CM

## 2015-06-15 DIAGNOSIS — R109 Unspecified abdominal pain: Secondary | ICD-10-CM

## 2015-06-15 DIAGNOSIS — D126 Benign neoplasm of colon, unspecified: Secondary | ICD-10-CM

## 2015-06-15 MED ORDER — MESALAMINE 1.2 G PO TBEC: 4.8000 g | DELAYED_RELEASE_TABLET | Freq: Every day | ORAL | Status: DC

## 2015-06-15 MED ORDER — CIPROFLOXACIN HCL 500 MG PO TABS: 500.0000 mg | ORAL_TABLET | Freq: Two times a day (BID) | ORAL | Status: AC

## 2015-06-15 MED ORDER — METRONIDAZOLE 500 MG PO TABS: 500.0000 mg | ORAL_TABLET | Freq: Three times a day (TID) | ORAL | Status: AC

## 2015-06-15 MED ORDER — BUDESONIDE 9 MG PO TB24: 9.0000 mg | ORAL_TABLET | Freq: Every day | ORAL | Status: AC

## 2015-06-15 MED ORDER — MESALAMINE 1.2 G PO TBEC: 4.8000 g | DELAYED_RELEASE_TABLET | Freq: Every day | ORAL | Status: AC

## 2015-06-15 NOTE — Patient Instructions (Addendum)
Please continue Lialda 4.8 g daily.  We have sent the following medications to your pharmacy for you to pick up at your convenience: Cipro 500 mg twice daily x 7 days Flagyl 500 mg three times daily x 7 days.  If your left sided abdominal pain does not improve after taking cipro and flagyl, start Uceris 9 mg daily x 8 weeks.  If your symptoms do not improve or worsen with the above measures, please call our office.  You have been scheduled for a follow up with Dr Hilarie Fredrickson on Thursday, 08/27/15 @ 10:30 am.

## 2015-06-15 NOTE — Progress Notes (Signed)
Subjective:    Patient ID: Richard Soto, male    DOB: 1965/04/02, 50 y.o.   MRN: [id number]  HPI Richard Soto is a 50 year old male with a past medical history of multiple adenomatous colon polyps, left-sided colitis who is seen for follow-up. He also has a history of PE, stroke, possible lupus anticoagulant and hypertension. He was remotely on anticoagulation and has been following with neurology and hematology recently. He was initially seen here to evaluate left-sided abdominal pain and rectal bleeding. He came for upper endoscopy and colonoscopy which were performed on 04/28/2015. Upper endoscopy revealed moderate gastritis in the mid and distal stomach. This was biopsied. The duodenum was normal as was the esophagus. Gastric biopsies showed mild chronic inactive gastritis without H. pylori or metaplasia. Normal small bowel biopsies. Colonoscopy revealed multiple colon polyps. 2 polyps removed from the ascending colon found to be tubular adenoma. 6 polyps in the transverse colon found to be tubular adenomas. A pedunculated descending polyp found to be to mother adenoma. A sigmoid colon polyp also found to be tubular adenoma. There was evidence of moderate diverticulosis in the left colon and transverse colon along with inflammatory change which endoscopically was mild. These biopsies showed focal active colitis. After this procedure Lialda was prescribed at 4.8 g daily and he returns for follow-up.  He is still having issues with left-sided abdominal pain. This is sharp and cramping in nature. He feels that it is slightly better with the Lialda. His bowel movements have been soft mostly formed stool occurring every 2-3 days. Not completely regular for him. He denies true diarrhea or constipation. He still seen some minor blood in his stool. No hematochezia or melena. Appetite has been mostly good. He denies nausea or vomiting. He denies heartburn. He is had no known side effects from the  [place].  Review of Systems As per history of present illness, otherwise negative  Current Medications, Allergies, Past Medical History, Past Surgical History, Family History and Social History were reviewed in [employer] record.     Objective:   Physical Exam BP 122/70 mmHg  Pulse 68  Ht 5\' 8"  (1.727 m)  Wt 195 lb 6.4 oz (88.633 kg)  BMI 29.72 kg/m2 Constitutional: Well-developed and well-nourished. No distress. HEENT: Normocephalic and atraumatic. Oropharynx is clear and moist. No oropharyngeal exudate. Conjunctivae are normal.  No scleral icterus. Poor dentition Neck: Neck supple. Trachea midline. Cardiovascular: Normal rate, regular rhythm and intact distal pulses. No M/R/G Pulmonary/chest: Effort normal and breath sounds normal. No wheezing, rales or rhonchi. Abdominal: Soft, left mid and lower quadrant abdominal tenderness without rebound or guarding, nondistended. Bowel sounds active throughout. There are no masses palpable.  Extremities: no clubbing, cyanosis, or edema Neurological: Alert and oriented to person place and time. Skin: Skin is warm and dry. No rashes noted. Psychiatric: Normal mood and affect. Behavior is normal.  CBC    Component Value Date/Time   WBC 6.5 06/04/2015 1524   WBC 11.1* 04/17/2015 1557   RBC 4.35 06/04/2015 1524   RBC 4.83 04/17/2015 1557   HGB 13.4 06/04/2015 1524   HGB 15.4 04/17/2015 1557   HCT 39.6 06/04/2015 1524   HCT 44.5 04/17/2015 1557   PLT 296 06/04/2015 1524   PLT 189 04/17/2015 1557   MCV 91.0 06/04/2015 1524   MCV 92.1 04/17/2015 1557   MCH 30.8 06/04/2015 1524   MCH 31.9 04/17/2015 1557   MCHC 33.8 06/04/2015 1524   MCHC 34.6 04/17/2015 1557   RDW  12.9 06/04/2015 1524   RDW 12.0 04/17/2015 1557   LYMPHSABS 2.1 06/04/2015 1524   LYMPHSABS 3.7 02/24/2015 2233   MONOABS 0.5 06/04/2015 1524   MONOABS 0.6 02/24/2015 2233   EOSABS 0.3 06/04/2015 1524   EOSABS 0.2 02/24/2015 2233   BASOSABS 0.0  06/04/2015 1524   BASOSABS 0.0 02/24/2015 2233    CMP     Component Value Date/Time   NA 138 06/04/2015 1524   NA 136 04/17/2015 1557   K 4.0 06/04/2015 1524   K 4.0 04/17/2015 1557   CL 100* 04/17/2015 1557   CO2 25 06/04/2015 1524   CO2 24 04/17/2015 1557   GLUCOSE 123 06/04/2015 1524   GLUCOSE 120* 04/17/2015 1557   BUN 10.4 06/04/2015 1524   BUN 14 04/17/2015 1557   CREATININE 0.8 06/04/2015 1524   CREATININE 0.61 04/17/2015 1557   CALCIUM 9.9 06/04/2015 1524   CALCIUM 10.1 04/17/2015 1557   PROT 7.5 06/04/2015 1524   PROT 6.5 04/17/2015 2141   ALBUMIN 3.9 06/04/2015 1524   ALBUMIN 3.7 04/17/2015 2141   AST 18 06/04/2015 1524   AST 19 04/17/2015 2141   ALT 27 06/04/2015 1524   ALT 23 04/17/2015 2141   ALKPHOS 132 06/04/2015 1524   ALKPHOS 115 04/17/2015 2141   BILITOT <0.30 06/04/2015 1524   BILITOT 1.0 04/17/2015 2141   GFRNONAA >60 04/17/2015 1557   GFRAA >60 04/17/2015 1557   CT abd Feb 2017 reviewed today    Assessment & Plan:  50 year old male with a past medical history of multiple adenomatous colon polyps, left-sided colitis who is seen for follow-up.  1. Left-sided colitis with left-sided abdominal pain -- given his ongoing symptoms I feel that the colitis is likely inflammatory in nature. He seems to have benefited slightly from [place] though not as much as I had hoped. Am going to treat with antibiotics for 7 days given the extensive diverticulosis seen in the left colon just to ensure there is no infectious component. Cipro 500 mg twice a day and metronidazole 500 mg 3 times a day 7 days. If no improvement I'm going to start Uceris 9 mg daily x 8 weeks.  I asked that he call if his symptoms worsen or change or fail to improve in the next several weeks. He voices understanding. Depending on response we will discuss possible escalation of therapy  2. Multiple adenomatous colon polyps -- repeat colonoscopy recommended in 12 months. He is aware of this  recommendation. Genetic evaluation given numerous adenomatous colon polyps discussed, he will consider.  25 minutes spent with the patient today. Greater than 50% was spent in counseling and coordination of care with the patient   1.

## 2015-08-14 ENCOUNTER — Ambulatory Visit: Payer: Medicaid Other | Admitting: Nurse Practitioner

## 2015-08-19 ENCOUNTER — Encounter: Payer: Self-pay | Admitting: Nurse Practitioner

## 2015-08-27 ENCOUNTER — Ambulatory Visit: Payer: Medicaid Other | Admitting: Internal Medicine

## 2015-09-08 ENCOUNTER — Emergency Department (HOSPITAL_COMMUNITY): Payer: Self-pay

## 2015-09-08 ENCOUNTER — Emergency Department (HOSPITAL_COMMUNITY)
Admission: EM | Admit: 2015-09-08 | Discharge: 2015-09-08 | Disposition: A | Discharge status: Home / Self Care | Payer: Self-pay | Attending: Emergency Medicine | Admitting: Emergency Medicine

## 2015-09-08 ENCOUNTER — Encounter (HOSPITAL_COMMUNITY): Payer: Self-pay

## 2015-09-08 DIAGNOSIS — Z5181 Encounter for therapeutic drug level monitoring: Secondary | ICD-10-CM | POA: Insufficient documentation

## 2015-09-08 DIAGNOSIS — Z8673 Personal history of transient ischemic attack (TIA), and cerebral infarction without residual deficits: Secondary | ICD-10-CM | POA: Insufficient documentation

## 2015-09-08 DIAGNOSIS — Z96659 Presence of unspecified artificial knee joint: Secondary | ICD-10-CM | POA: Insufficient documentation

## 2015-09-08 DIAGNOSIS — R079 Chest pain, unspecified: Secondary | ICD-10-CM | POA: Insufficient documentation

## 2015-09-08 DIAGNOSIS — G43809 Other migraine, not intractable, without status migrainosus: Secondary | ICD-10-CM | POA: Insufficient documentation

## 2015-09-08 DIAGNOSIS — G441 Vascular headache, not elsewhere classified: Secondary | ICD-10-CM

## 2015-09-08 DIAGNOSIS — R51 Headache: Secondary | ICD-10-CM

## 2015-09-08 DIAGNOSIS — I10 Essential (primary) hypertension: Secondary | ICD-10-CM | POA: Insufficient documentation

## 2015-09-08 DIAGNOSIS — R531 Weakness: Secondary | ICD-10-CM | POA: Insufficient documentation

## 2015-09-08 DIAGNOSIS — F17210 Nicotine dependence, cigarettes, uncomplicated: Secondary | ICD-10-CM | POA: Insufficient documentation

## 2015-09-08 LAB — CBC
HEMATOCRIT: 42.7 % (ref 39.0–52.0)
HEMOGLOBIN: 14.4 g/dL (ref 13.0–17.0)
MCH: 30.6 pg (ref 26.0–34.0)
MCHC: 33.7 g/dL (ref 30.0–36.0)
MCV: 90.9 fL (ref 78.0–100.0)
PLATELETS: 234 10*3/uL (ref 150–400)
RBC: 4.7 MIL/uL (ref 4.22–5.81)
RDW: 14.1 % (ref 11.5–15.5)
WBC: 8 10*3/uL (ref 4.0–10.5)

## 2015-09-08 LAB — COMPREHENSIVE METABOLIC PANEL
ALK PHOS: 132 U/L — AB (ref 38–126)
ALT: 18 U/L (ref 17–63)
AST: 19 U/L (ref 15–41)
Albumin: 4 g/dL (ref 3.5–5.0)
Anion gap: 8 (ref 5–15)
BILIRUBIN TOTAL: 0.2 mg/dL — AB (ref 0.3–1.2)
BUN: 11 mg/dL (ref 6–20)
CALCIUM: 9.8 mg/dL (ref 8.9–10.3)
CO2: 22 mmol/L (ref 22–32)
Chloride: 107 mmol/L (ref 101–111)
Creatinine, Ser: 0.8 mg/dL (ref 0.61–1.24)
GFR calc non Af Amer: >60 mL/min (ref >60)
GLUCOSE: 81 mg/dL (ref 65–99)
Potassium: 3.7 mmol/L (ref 3.5–5.1)
SODIUM: 137 mmol/L (ref 135–145)
Total Protein: 7 g/dL (ref 6.5–8.1)

## 2015-09-08 LAB — URINALYSIS, ROUTINE W REFLEX MICROSCOPIC
BILIRUBIN URINE: NEGATIVE
GLUCOSE, UA: NEGATIVE mg/dL
HGB URINE DIPSTICK: NEGATIVE
KETONES UR: NEGATIVE mg/dL
Leukocytes, UA: NEGATIVE
Nitrite: NEGATIVE
PH: 6.5 (ref 5.0–8.0)
Protein, ur: NEGATIVE mg/dL
Specific Gravity, Urine: 1.009 (ref 1.005–1.030)

## 2015-09-08 LAB — D-DIMER, QUANTITATIVE: D-Dimer, Quant: 0.33 ug/mL-FEU (ref 0.00–0.50)

## 2015-09-08 LAB — PROTIME-INR
INR: 1.07 (ref 0.00–1.49)
Prothrombin Time: 14.1 seconds (ref 11.6–15.2)

## 2015-09-08 LAB — I-STAT TROPONIN, ED: Troponin i, poc: 0 ng/mL (ref 0.00–0.08)

## 2015-09-08 LAB — RAPID URINE DRUG SCREEN, HOSP PERFORMED
AMPHETAMINES: NOT DETECTED
BENZODIAZEPINES: NOT DETECTED
Barbiturates: NOT DETECTED
COCAINE: NOT DETECTED
OPIATES: NOT DETECTED
TETRAHYDROCANNABINOL: NOT DETECTED

## 2015-09-08 LAB — I-STAT CHEM 8, ED
BUN: 13 mg/dL (ref 6–20)
CALCIUM ION: 1.17 mmol/L (ref 1.13–1.30)
CHLORIDE: 106 mmol/L (ref 101–111)
CREATININE: 0.7 mg/dL (ref 0.61–1.24)
GLUCOSE: 78 mg/dL (ref 65–99)
HCT: 45 % (ref 39.0–52.0)
Hemoglobin: 15.3 g/dL (ref 13.0–17.0)
Potassium: 4.1 mmol/L (ref 3.5–5.1)
Sodium: 140 mmol/L (ref 135–145)
TCO2: 23 mmol/L (ref 0–100)

## 2015-09-08 LAB — DIFFERENTIAL
Basophils Absolute: 0 10*3/uL (ref 0.0–0.1)
Basophils Relative: 0 %
Eosinophils Absolute: 0.3 10*3/uL (ref 0.0–0.7)
Eosinophils Relative: 3 %
Lymphocytes Relative: 28 %
Lymphs Abs: 2.3 10*3/uL (ref 0.7–4.0)
MONO ABS: 0.6 10*3/uL (ref 0.1–1.0)
Monocytes Relative: 7 %
NEUTROS ABS: 4.9 10*3/uL (ref 1.7–7.7)
Neutrophils Relative %: 62 %

## 2015-09-08 LAB — ETHANOL: Alcohol, Ethyl (B): <5 mg/dL (ref <5)

## 2015-09-08 LAB — APTT: aPTT: 30 seconds (ref 24–37)

## 2015-09-08 MED ORDER — DIPHENHYDRAMINE HCL 50 MG/ML IJ SOLN
25.0000 mg | Freq: Once | INTRAMUSCULAR | Status: AC
  Administered 2015-09-08: 25 mg via INTRAVENOUS
  Filled 2015-09-08: qty 1

## 2015-09-08 MED ORDER — KETOROLAC TROMETHAMINE 30 MG/ML IJ SOLN
30.0000 mg | Freq: Once | INTRAMUSCULAR | Status: AC
  Administered 2015-09-08: 30 mg via INTRAVENOUS
  Filled 2015-09-08: qty 1

## 2015-09-08 MED ORDER — DIVALPROEX SODIUM 500 MG PO DR TAB: 500.0000 mg | DELAYED_RELEASE_TABLET | Freq: Two times a day (BID) | ORAL | Status: AC

## 2015-09-08 MED ORDER — PROCHLORPERAZINE EDISYLATE 5 MG/ML IJ SOLN
10.0000 mg | Freq: Once | INTRAMUSCULAR | Status: AC
  Administered 2015-09-08: 10 mg via INTRAVENOUS
  Filled 2015-09-08: qty 2

## 2015-09-08 MED ORDER — LORAZEPAM 2 MG/ML IJ SOLN
0.5000 mg | Freq: Once | INTRAMUSCULAR | Status: AC
  Administered 2015-09-08: 0.5 mg via INTRAVENOUS
  Filled 2015-09-08: qty 1

## 2015-09-08 MED ORDER — VALPROATE SODIUM 500 MG/5ML IV SOLN
500.0000 mg | INTRAVENOUS | Status: AC
  Administered 2015-09-08: 500 mg via INTRAVENOUS
  Filled 2015-09-08: qty 5

## 2015-09-08 NOTE — Consult Note (Signed)
NEURO HOSPITALIST CONSULT NOTE      Reason for Consult: headache   History obtained from:  Patient     HPI:                                                                                                                                          Richard Soto is an 50 yo male with hx of complex migraine, CVA with some mild L sided weakness, anxiety and depression and chronic pain presents with worsening headache and L sided motor symptoms. He states due to his insurance issues he has not been able to take his medications for over a month.   Past Medical History  Diagnosis Date  . Pulmonary embolism ([place])   . Stroke ([place])   . Hypertension   . 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 Hepatic steatosis   . Tubular adenoma of colon 04/2015    multiple    Past Surgical History  Procedure Laterality Date  . Replacement total knee    . Radiology with anesthesia N/A 02/24/2015    Procedure: RADIOLOGY WITH ANESTHESIA;  Surgeon: Medication Radiologist, MD;  Location: [place];  Service: Radiology;  Laterality: N/A;  . Cholecystectomy  2016    Family History  Problem Relation Age of Onset  . Lung cancer Father   . Diabetes Mother       Social History:  reports that he has been smoking Cigarettes.  He has been smoking about 0.50 packs per day. He has never used smokeless tobacco. He reports that he does not drink alcohol or use illicit drugs.  Allergies  Allergen Reactions  . Fentanyl Other (See Comments)    Paradoxical response    MEDICATIONS:                                                                                                                     I have reviewed the patient's current medications.   ROS:  History obtained from chart review and the patient  General ROS: negative for - chills, fatigue, fever, night sweats, weight  gain or weight loss Psychological ROS: negative for - behavioral disorder, hallucinations, memory difficulties, mood swings or suicidal ideation Ophthalmic ROS: negative for - blurry vision, double vision, eye pain or loss of vision ENT ROS: negative for - epistaxis, nasal discharge, oral lesions, sore throat, tinnitus or vertigo Allergy and Immunology ROS: negative for - hives or itchy/watery eyes Hematological and Lymphatic ROS: negative for - bleeding problems, bruising or swollen lymph nodes Endocrine ROS: negative for - galactorrhea, hair pattern changes, polydipsia/polyuria or temperature intolerance Respiratory ROS: negative for - cough, hemoptysis, shortness of breath or wheezing Cardiovascular ROS: negative for - chest pain, dyspnea on exertion, edema or irregular heartbeat Gastrointestinal ROS: negative for - abdominal pain, diarrhea, hematemesis, nausea/vomiting or stool incontinence Genito-Urinary ROS: negative for - dysuria, hematuria, incontinence or urinary frequency/urgency Musculoskeletal ROS: negative for - joint swelling or muscular weakness Neurological ROS: as noted in HPI Dermatological ROS: negative for rash and skin lesion changes   Blood pressure 173/89, pulse 48, temperature 97.8 F (36.6 C), temperature source Axillary, resp. rate 27, SpO2 94 %.   Neurologic Examination:                                                                                                      HEENT-  Normocephalic, no lesions, without obvious abnormality.  Normal external eye and conjunctiva.  Normal TM's bilaterally.  Normal auditory canals and external ears. Normal external nose, mucus membranes and septum.  Normal pharynx. Cardiovascular- regular rate and rhythm, S1, S2 normal, no murmur, click, rub or gallop, pulses palpable throughout   Lungs- chest clear, no wheezing, rales, normal symmetric air entry, Heart exam - S1, S2 normal, no murmur, no gallop, rate regular Abdomen- soft,  non-tender; bowel sounds normal; no masses,  no organomegaly   Neurological Examination Mental Status: Alert, oriented, thought content appropriate.  Speech fluent without evidence of aphasia.  Able to follow 3 step commands without difficulty. Cranial Nerves: II  Visual fields grossly normal, pupils equal, round, reactive to light and accommodation III,IV, VI: ptosis not present, extra-ocular motions intact bilaterally V,VII: smile asymmetric, VIII: hearing normal bilaterally IX,X: uvula rises symmetrically XI: bilateral shoulder shrug XII: midline tongue extension Motor: Right : Upper extremity   5/5 Functional tremor on the right hand   Left:     Upper extremity   4/5 no drift   Lower extremity   5/5     Lower extremity   4/5 Tone and bulk:normal tone throughout; no atrophy noted Cerebellar: normal finger-to-nose\       Lab Results: Basic Metabolic Panel:  Recent Labs Lab 09/08/15 1430 09/08/15 1439  NA 137 140  K 3.7 4.1  CL 107 106  CO2 22  --   GLUCOSE 81 78  BUN 11 13  CREATININE 0.80 0.70  CALCIUM 9.8  --     Liver Function Tests:  Recent Labs Lab 09/08/15 1430  AST 19  ALT 18  ALKPHOS 132*  BILITOT 0.2*  PROT 7.0  ALBUMIN 4.0   No results for input(s): LIPASE, AMYLASE in the last 168 hours. No results for input(s): AMMONIA in the last 168 hours.  CBC:  Recent Labs Lab 09/08/15 1430 09/08/15 1439  WBC 8.0  --   NEUTROABS 4.9  --   HGB 14.4 15.3  HCT 42.7 45.0  MCV 90.9  --   PLT 234  --     Cardiac Enzymes: No results for input(s): CKTOTAL, CKMB, CKMBINDEX, TROPONINI in the last 168 hours.  Lipid Panel: No results for input(s): CHOL, TRIG, HDL, CHOLHDL, VLDL, LDLCALC in the last 168 hours.  CBG: No results for input(s): GLUCAP in the last 168 hours.  Microbiology: Results for orders placed or performed during the hospital encounter of 02/24/15  MRSA PCR Screening     Status: None   Collection Time: 02/25/15  1:10 AM  Result  Value Ref Range Status   MRSA by PCR NEGATIVE NEGATIVE Final    Comment:        The GeneXpert MRSA Assay (FDA approved for NASAL specimens only), is one component of a comprehensive MRSA colonization surveillance program. It is not intended to diagnose MRSA infection nor to guide or monitor treatment for MRSA infections.     Coagulation Studies:  Recent Labs  09/08/15 1430  LABPROT 14.1  INR 1.07    Imaging: Dg Chest 2 View  09/08/2015  CLINICAL DATA:  Chest pain and shortness of Breath for 2 days EXAM: CHEST  2 VIEW COMPARISON:  04/17/2015 FINDINGS: Cardiac shadow is stable. The lungs are clear bilaterally. No acute bony abnormality is noted. No soft tissue changes are seen. IMPRESSION: No active cardiopulmonary disease. Electronically Signed   By: Inez Catalina M.D.   On: 09/08/2015 14:44   Ct Head Wo Contrast  09/08/2015  CLINICAL DATA:  Headache with left-sided weakness EXAM: CT HEAD WITHOUT CONTRAST TECHNIQUE: Contiguous axial images were obtained from the base of the skull through the vertex without intravenous contrast. COMPARISON:  Head CT February 24, 2015; brain MRI May 26, 2015 FINDINGS: Brain: The ventricles are normal in size and configuration. There is no intracranial mass, hemorrhage, extra-axial fluid collection, or midline shift. Gray-white compartments appear unremarkable. No acute infarct is evident currently. Vascular: There are foci of calcification in both cavernous carotid arteries. The middle cerebral artery attenuation currently is within normal limits bilaterally. Skull: Bony calvarium appears intact. Sinuses/Orbits: Orbits appear symmetric bilaterally. Visualized paranasal sinuses are clear. Other: Mastoid air cells are clear. IMPRESSION: There is arterial vascular calcification in both cavernous carotid arteries. There is no intracranial mass, hemorrhage, or focal gray - white compartment lesions/acute appearing infarct evident. Electronically Signed   By:  Lowella Grip III M.D.   On: 09/08/2015 14:56        Assessment/Plan:   50 yo male with hx of complex migraine, CVA with some mild L sided weakness, anxiety and depression and chronic pain presents with worsening headache and L sided motor symptoms. He states due to his insurance issues he has not been able to take his medications for over a month.  This is most likely a complex migraine with worsening symptoms   - Recommend migraine cocktail as given by ED - Topamax patient states is too expensive without insurance, can can verapimil 160mg  q8hrs  - MRI brain stroke protocol to rule out acute process

## 2015-09-08 NOTE — ED Notes (Signed)
Patient presents to the er with complaints of weakness on his left side, patient had a stroke in December and has some residual on the left side from that, patient states that it got worse at midnight last night, patient is also complaining of chest pressure

## 2015-09-08 NOTE — ED Provider Notes (Signed)
Received care of pt from Dr. Alvino Chapel at [facility]. Briefly, this is a 50yo male with history of PE, CVA, migraine, previous workup for weakness presents with concern for headache and weakness.  Neurology consulted, plan for MRI brain and migraine cocktail with reevaluation.  Neg ddimer, neg trop, hx not consistent with dissection, normal peripheral pulses.   MR Brain without acute abnormalities.  Patient reevaluated and reports headache had improved however is returning.  Given second migraine cocktail and depakote.  Initial plan was for discharge with verapamil, however patient with bradycardia and discussed other options with Neurology on call and will use Depakote BID.  Patient reports significant improvement in headache and weakness after treatment.  Patient with sinus bradycardia with rest, seen to have bradycardia in office visits to 50-60. No lightheadedness, no bradycardia, no chest pain. Pt appropriate for close PCP and Neurology follow up.   Gareth Morgan, MD 09/10/15 0030

## 2015-09-08 NOTE — ED Provider Notes (Signed)
CSN: [id number]     Arrival date & time 09/08/15  1352 History   First MD Initiated Contact with Patient 09/08/15 1358     Chief Complaint  Patient presents with  . Extremity Weakness  . Chest Pain     Patient is a 50 y.o. male presenting with extremity weakness and chest pain.  Extremity Weakness Associated symptoms include chest pain. Pertinent negatives include no abdominal pain.  Chest Pain Associated symptoms: numbness and weakness   Associated symptoms: no abdominal pain, no back pain and no cough   Patient presents with headache chest pain and weakness. States began last night. Reported history of stroke and pulmonary embolisms. No fevers. States the headache is on the top of his head and severe. Previously on anticoagulation but able to afford it and had some bleeding. Records reviewed and he had not had a stroke he had presumed nonorganic cause of his weakness. 2 negative head CTs and a negative catheter angiography.Patient reportedly was not able to continue on his Topamax due to cost.   Past Medical History  Diagnosis Date  . Pulmonary embolism ([place])   . Stroke ([place])   . Hypertension   . 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 Hepatic steatosis   . Tubular adenoma of colon 04/2015    multiple   Past Surgical History  Procedure Laterality Date  . Replacement total knee    . Radiology with anesthesia N/A 02/24/2015    Procedure: RADIOLOGY WITH ANESTHESIA;  Surgeon: Medication Radiologist, MD;  Location: [place];  Service: Radiology;  Laterality: N/A;  . Cholecystectomy  2016   Family History  Problem Relation Age of Onset  . Lung cancer Father   . Diabetes Mother    Social History  Substance Use Topics  . Smoking status: Current Some Day Smoker -- 0.50 packs/day    Types: Cigarettes  . Smokeless tobacco: Never Used     Comment: 5 cigarettes per day   . Alcohol Use: No     Comment: 6 pack a week, pt quit drinking    Review of Systems  Constitutional: Negative for appetite  change.  Eyes: Negative for visual disturbance.  Respiratory: Negative for cough.   Cardiovascular: Positive for chest pain.  Gastrointestinal: Negative for abdominal pain.  Musculoskeletal: Positive for extremity weakness. Negative for back pain.  Skin: Negative for wound.  Neurological: Positive for weakness and numbness.      Allergies  Fentanyl  Home Medications   Prior to Admission medications   Medication Sig Start Date End Date Taking? Authorizing Provider  citalopram (CELEXA) 40 MG tablet Take 40 mg by mouth daily. 07/29/15 07/28/16 Yes Historical Provider, MD  oxyCODONE (ROXICODONE) 5 MG immediate release tablet Take 5 mg by mouth every 8 (eight) hours as needed for moderate pain.  05/01/15 04/30/16 Yes Historical Provider, MD  traZODone (DESYREL) 50 MG tablet Take 50 mg by mouth at bedtime as needed for sleep.   Yes Historical Provider, MD  amLODipine (NORVASC) 10 MG tablet Take 1 tablet (10 mg total) by mouth daily. Patient not taking: Reported on 09/08/2015 03/02/15   Shanon Brow L Rinehuls, PA-C  atorvastatin (LIPITOR) 20 MG tablet Take 1 tablet (20 mg total) by mouth daily at 6 PM. Patient not taking: Reported on 09/08/2015 03/02/15   Shanon Brow L Rinehuls, PA-C  Budesonide (UCERIS) 9 MG TB24 Take 9 mg by mouth daily. X 8 weeks (if cipro and flagyl do not improve symptoms) Patient not taking: Reported on 09/08/2015 06/15/15  Jerene Bears, MD  ciprofloxacin (CIPRO) 500 MG tablet Take 1 tablet (500 mg total) by mouth 2 (two) times daily. Patient not taking: Reported on 09/08/2015 06/15/15   Jerene Bears, MD  mesalamine (LIALDA) 1.2 g EC tablet Take 4 tablets (4.8 g total) by mouth daily with breakfast. Patient not taking: Reported on 09/08/2015 06/15/15   Jerene Bears, MD  metroNIDAZOLE (FLAGYL) 500 MG tablet Take 1 tablet (500 mg total) by mouth 3 (three) times daily. Patient not taking: Reported on 09/08/2015 06/15/15   Jerene Bears, MD  tamsulosin [facility]) 0.4 MG CAPS capsule Take 1 capsule (0.4  mg total) by mouth daily after breakfast. Patient not taking: Reported on 09/08/2015 03/02/15   Shanon Brow L Rinehuls, PA-C  topiramate (TOPAMAX) 25 MG tablet Take 1 tablet (25 mg total) by mouth 2 (two) times daily. Patient not taking: Reported on 09/08/2015 05/14/15   Garvin Fila, MD   BP 173/89 mmHg  Pulse 48  Temp(Src) 97.8 F (36.6 C) (Axillary)  Resp 27  SpO2 94% Physical Exam  Constitutional: He is oriented to person, place, and time. He appears well-developed and well-nourished.  HENT:  Head: Normocephalic and atraumatic.  Eyes: EOM are normal. Pupils are equal, round, and reactive to light.  Neck: Normal range of motion. Neck supple.  Cardiovascular: Normal rate and regular rhythm.   No murmur heard. Pulmonary/Chest: Effort normal.  Abdominal: Soft. Bowel sounds are normal. He exhibits no distension and no mass. There is no tenderness. There is no rebound and no guarding.  Musculoskeletal: Normal range of motion. He exhibits no edema.  Neurological: He is alert and oriented to person, place, and time. No cranial nerve deficit.  Somewhat variable neurologic exam. Tremor on right side. Face symmetric. Equal smile. Extraocular movements intact. Possible weakness on left side but some question of effort. He initially would hold his hand up over his face but would later dropped. Complete NIH scoring done by neurology.  Skin: Skin is warm and dry.  Psychiatric: He has a normal mood and affect.  Nursing note and vitals reviewed.   ED Course  Procedures (including critical care time) Labs Review Labs Reviewed  COMPREHENSIVE METABOLIC PANEL - Abnormal; Notable for the following:    Alkaline Phosphatase 132 (*)    Total Bilirubin 0.2 (*)    All other components within normal limits  ETHANOL  PROTIME-INR  APTT  CBC  DIFFERENTIAL  URINE RAPID DRUG SCREEN, HOSP PERFORMED  URINALYSIS, ROUTINE W REFLEX MICROSCOPIC (NOT AT [facility])  D-DIMER, QUANTITATIVE (NOT AT [facility])  I-STAT CHEM 8, ED   Randolm Idol, ED    Imaging Review Dg Chest 2 View  09/08/2015  CLINICAL DATA:  Chest pain and shortness of Breath for 2 days EXAM: CHEST  2 VIEW COMPARISON:  04/17/2015 FINDINGS: Cardiac shadow is stable. The lungs are clear bilaterally. No acute bony abnormality is noted. No soft tissue changes are seen. IMPRESSION: No active cardiopulmonary disease. Electronically Signed   By: Inez Catalina M.D.   On: 09/08/2015 14:44   Ct Head Wo Contrast  09/08/2015  CLINICAL DATA:  Headache with left-sided weakness EXAM: CT HEAD WITHOUT CONTRAST TECHNIQUE: Contiguous axial images were obtained from the base of the skull through the vertex without intravenous contrast. COMPARISON:  Head CT February 24, 2015; brain MRI May 26, 2015 FINDINGS: Brain: The ventricles are normal in size and configuration. There is no intracranial mass, hemorrhage, extra-axial fluid collection, or midline shift. Gray-white compartments appear unremarkable. No  acute infarct is evident currently. Vascular: There are foci of calcification in both cavernous carotid arteries. The middle cerebral artery attenuation currently is within normal limits bilaterally. Skull: Bony calvarium appears intact. Sinuses/Orbits: Orbits appear symmetric bilaterally. Visualized paranasal sinuses are clear. Other: Mastoid air cells are clear. IMPRESSION: There is arterial vascular calcification in both cavernous carotid arteries. There is no intracranial mass, hemorrhage, or focal gray - white compartment lesions/acute appearing infarct evident. Electronically Signed   By: Lowella Grip III M.D.   On: 09/08/2015 14:56   I have personally reviewed and evaluated these images and lab results as part of my medical decision-making.   EKG Interpretation   Date/Time:  Tuesday September 08 2015 14:03:40 EDT Ventricular Rate:  55 PR Interval:    QRS Duration: 94 QT Interval:  427 QTC Calculation: 409 R Axis:   67 Text Interpretation:  Sinus rhythm Abnormal  R-wave progression, early  transition Confirmed by Alvino Chapel  MD, Ovid Curd [phone]) on 09/08/2015 2:14:15  PM      MDM   Final diagnoses:  Vascular headache  Chest pain, unspecified chest pain type    Patient with headache and possible weakness. Previous workup for similar weakness was extensive and did not find a cause. Seen by neurology. Recommend diffusion-weighted MR. Migraine cocktail given. Discharge on verapamil or norvasc for prophylaxis if negative per neuro EKG troponin and d-dimer negative for chest pain. Doubt PE at this time also.    Davonna Belling, MD 09/08/15 [phone]

## 2015-09-08 NOTE — ED Notes (Signed)
Pt c.o. Difficulty getting a good breathe. Pt placed on 2L O2 via [place]. Pt reports feeling some improvement. O2 sats 100%.

## 2016-02-22 ENCOUNTER — Encounter: Payer: Self-pay | Admitting: *Deleted

## 2016-03-24 ENCOUNTER — Encounter: Payer: Self-pay | Admitting: Internal Medicine

## 2017-01-20 IMAGING — XA IR ANGIO INTRA EXTRACRAN SEL COM CAROTID INNOMINATE BILAT MOD SE
1 series · 13 of 24 positions shown · IV contrast (IODINE)
Comparison: none

CLINICAL DATA: Left-sided weakness. Slurred speech, frothing at the
mouth. Abnormal CT of the brain.

[Series 300: ir percutaneous art thrombectomy/infusio · 13 of 119 slices shown]
[im 1/119]
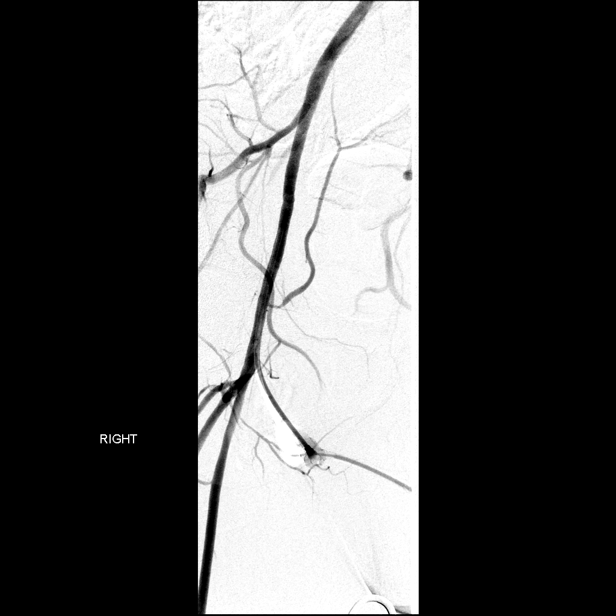
[im 11/119]
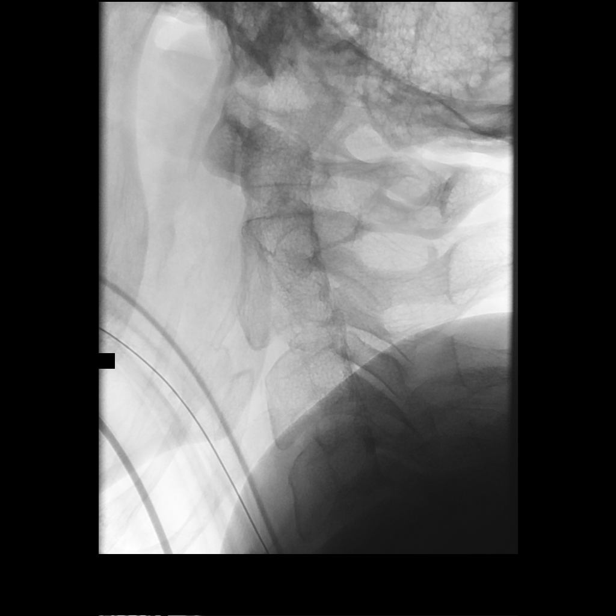
[im 21/119]
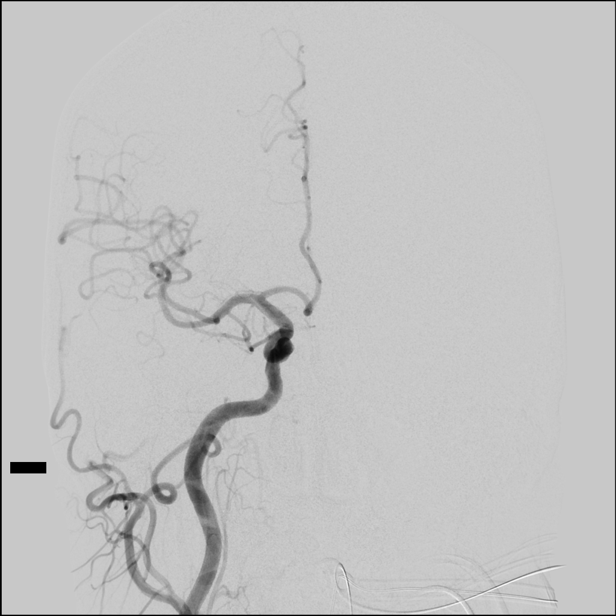
[im 31/119]
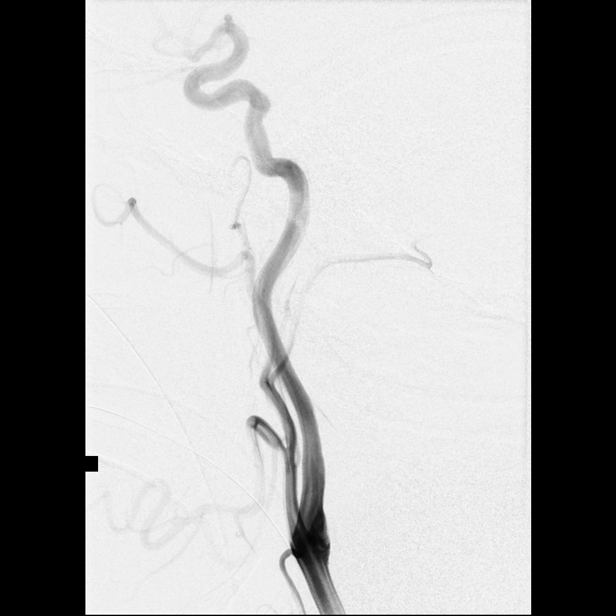
[im 42/119]
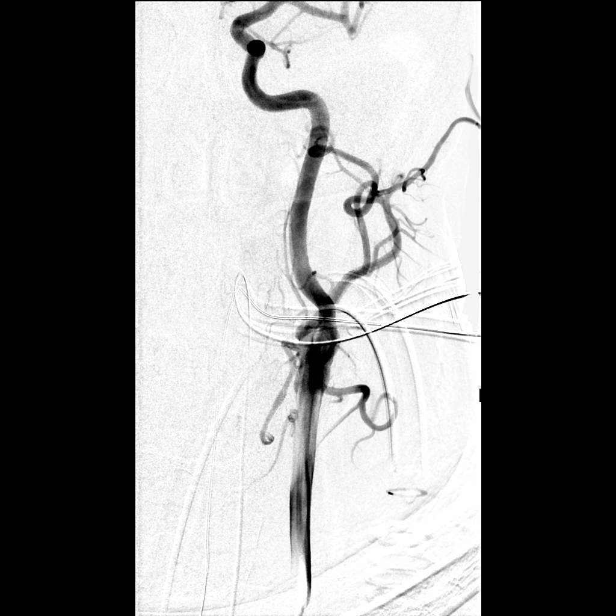
[im 52/119]
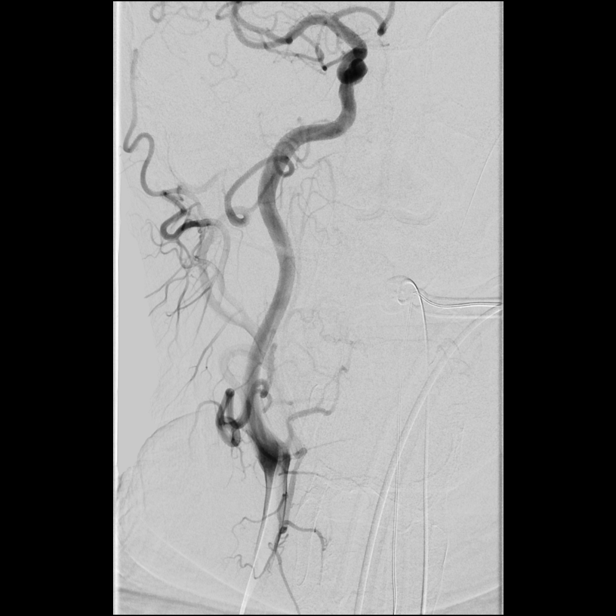
[im 62/119]
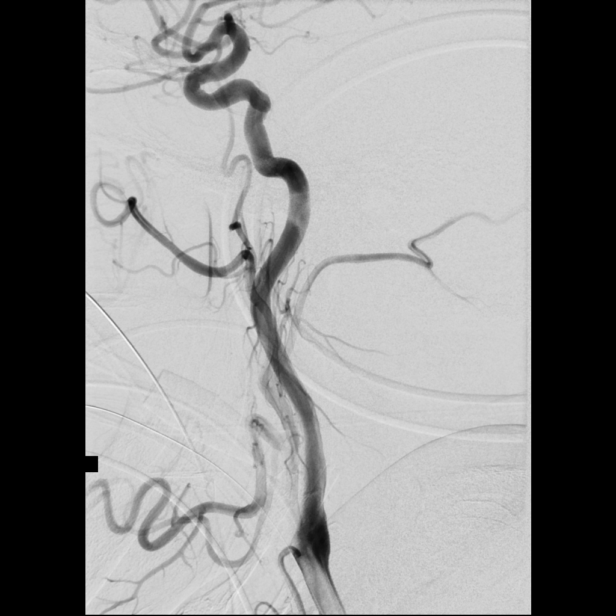
[im 67/119]
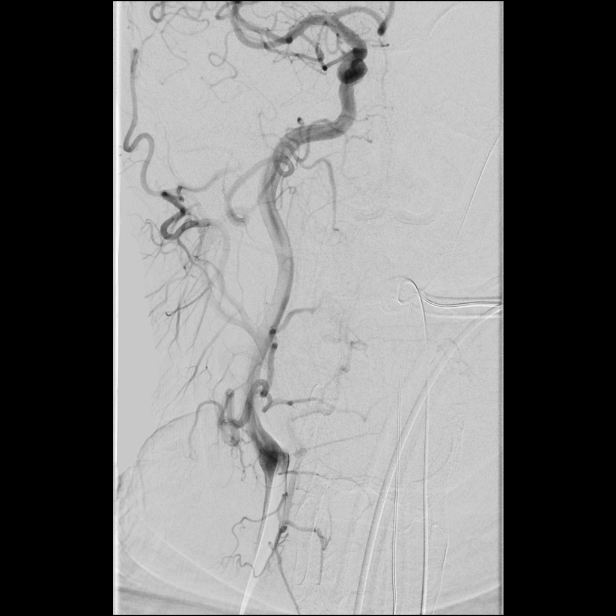
[im 77/119]
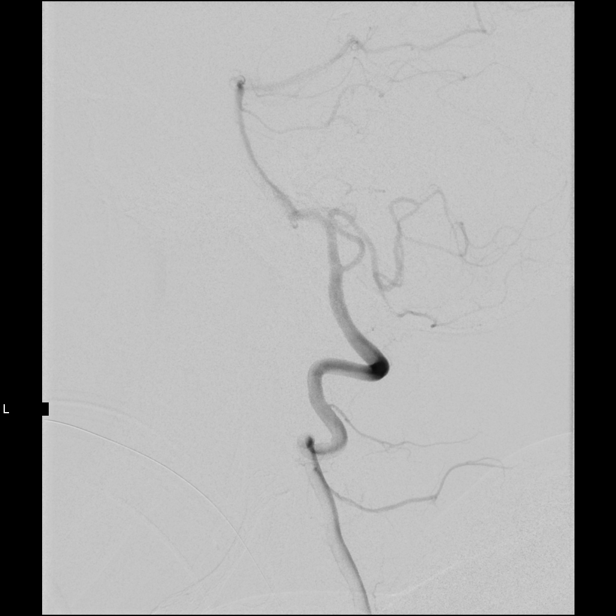
[im 88/119]
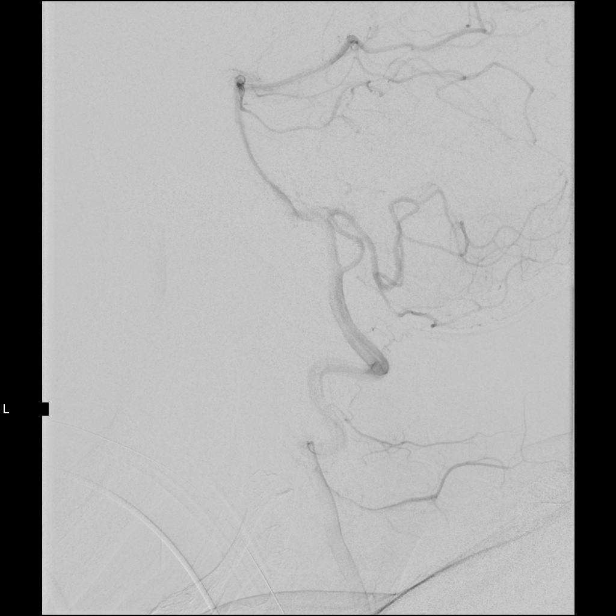
[im 98/119]
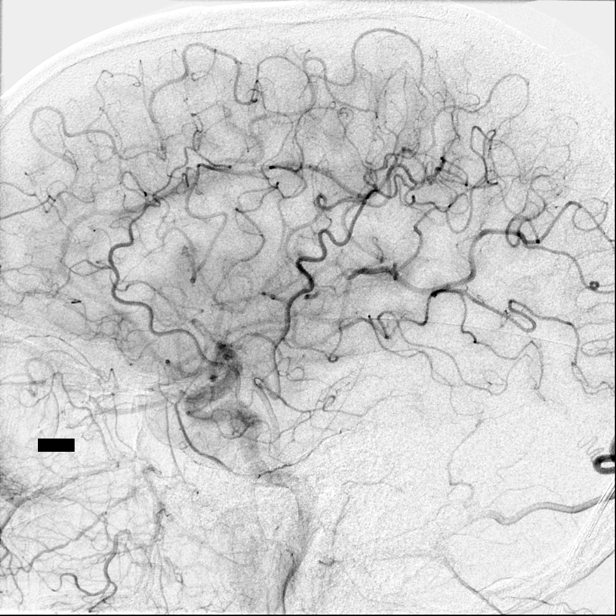
[im 108/119]
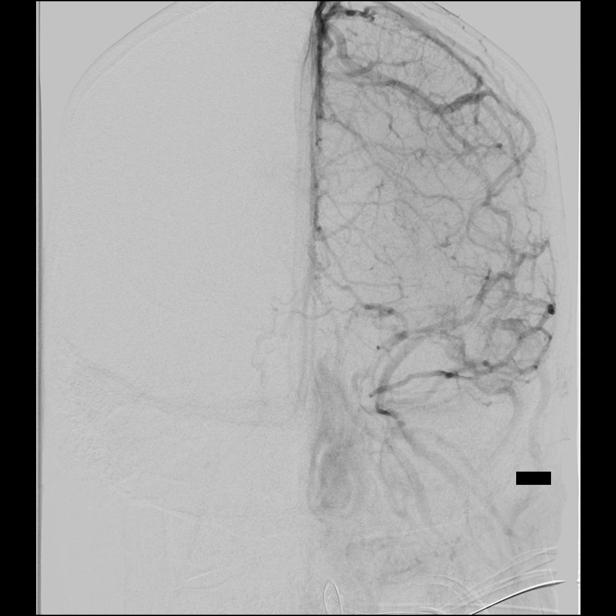
[im 119/119]
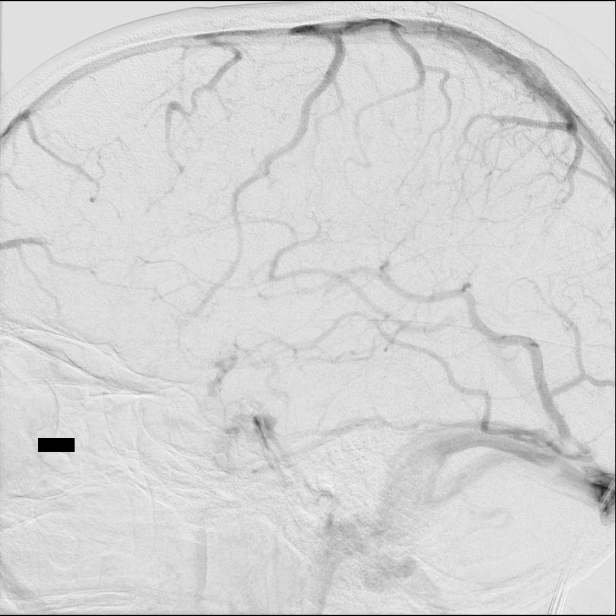

[13 of 24 positions shown; findings below may reference images not displayed]

EXAM:
IR ANGIO VERTEBRAL SEL SUBCLAVIAN INNOMINATE UNI LEFT MOD SED; IR
ANGIO VERTEBRAL SEL VERTEBRAL UNI RIGHT MOD SED; BILATERAL COMMON
CAROTID AND INNOMINATE ANGIOGRAPHY

ANESTHESIA/SEDATION:
General anesthesia.

MEDICATIONS:
As per general anesthesia.

CONTRAST:  50mL OMNIPAQUE IOHEXOL 300 MG/ML  SOLN

PROCEDURE:
Following full explanation of the procedure along with the potential
associated complications, an informed witnessed consent was obtained
from the patient's brother. The risks of worsening stroke,
intracranial hemorrhage of 7-10%, inability to revascularize
occluded blood vessel were all discussed with the patient's brother.

The right groin was prepped and draped in the usual sterile fashion.
Thereafter using modified Seldinger technique, transfemoral access
into the right common femoral artery was obtained without
difficulty. Over a 0.035 inch guidewire, a 5 French Pinnacle sheath
was inserted. Through this, and also over a 0.035 inch guidewire, a
5 French JB1 catheter was advanced to the aortic arch region and
selectively positioned in the right common carotid artery, the right
vertebral artery, the left common carotid artery and the left
vertebral artery.

COMPLICATIONS:
None immediate.
FINDINGS: The right common carotid arteriogram demonstrates the right external
carotid artery and its major branches to be widely patent.

The right internal carotid artery at the bulb to the cranial skull
base is widely patent.

There is mild fusiform dilatation of the proximal cavernous segment.

More distally the cavernous and the supraclinoid segments are widely
patent.

The right middle cerebral artery and the right anterior cerebral
arteries opacify into the capillary and the venous phases.

No evidence of occlusions, or of intraluminal filling defects were
seen.

The right vertebral artery origin is normal.

The vessel is seen to opacify normally to the cranial skull base.
Normal opacification is seen of the right posterior inferior
cerebellar artery and the right vertebrobasilar junction. The
basilar artery, the posterior cerebral arteries, superior cerebellar
arteries and the anterior-inferior cerebellar arteries opacify
normally into the capillary and the venous phases. Non-opacified
blood is seen in the basilar artery from the contralateral vertebral
artery.

The left common carotid arteriogram demonstrates the left external
carotid artery and its major branches to be widely patent.

The left internal carotid artery at the bulb to the cranial skull
base opacifies normally.

The petrous, the cavernous and the supraclinoid segments are widely
patent.

The left middle cerebral artery and the left anterior cerebral
artery opacify normally into the capillary and the venous phases.
Cross opacification via the anterior communicating artery of the
right anterior cerebral artery A2 segment is seen.

The left vertebral artery origin is normal.

The vessel is seen to opacify to the cranial skull base. Normal
opacification is seen of the left posterior inferior cerebellar
artery and the left vertebrobasilar junction.

The opacified portions of the basilar artery, the posterior cerebral
arteries, superior cerebellar arteries and the anterior-inferior
cerebellar arteries is widely patent into the delayed arterial
phase.
IMPRESSION: Angiographically, no evidence of occlusions, stenosis, dissections
or of intraluminal filling defects seen.

No evidence of aneurysms, or arteriovenous shunting.

Venous outflow within normal limits.

## 2017-08-04 IMAGING — MR MR HEAD W/O CM
9 of 10 series · 35 of 48 positions shown · non-contrast
Comparison: CT head 09/08/2015 MRI 05/26/2015

CLINICAL DATA: Left-sided weakness.  History migraine headache.

EXAM:
MRI HEAD WITHOUT CONTRAST
TECHNIQUE: Multiplanar, multiecho pulse sequences of the brain and surrounding
structures were obtained without intravenous contrast.

[Series 2: FLAIR · sagittal · 5.0mm · 0.47mm/px · 3 of 23 slices shown (1 of 2)]
[im 1/23]
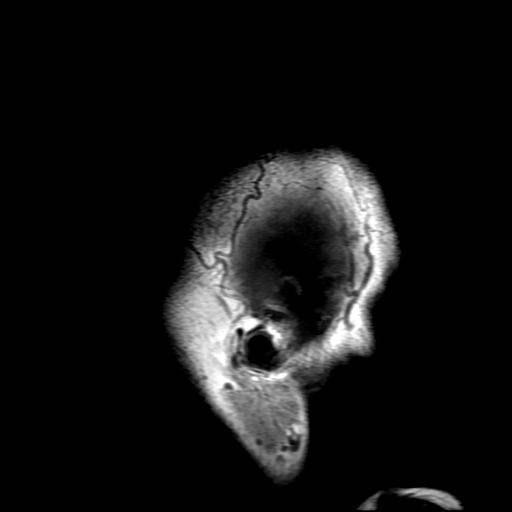
[im 12/23]
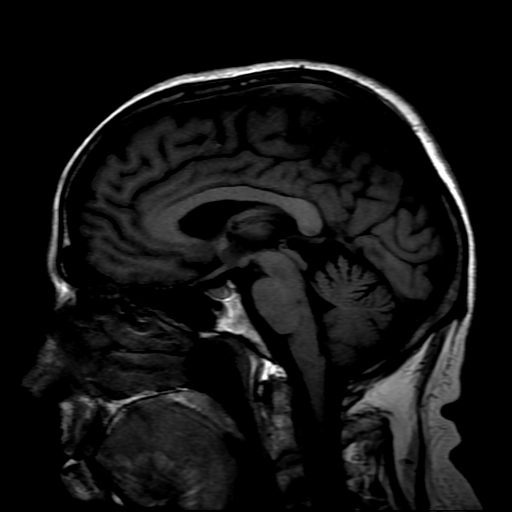
[im 23/23]
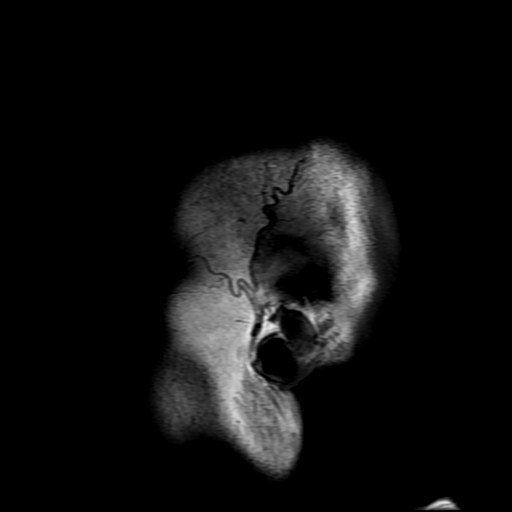

[Series 4: DWI · axial · 3.0mm · 0.94mm/px · z∈[-53,+94]mm · 9 of 99 slices shown (1 of 2)]
[im 1/99]
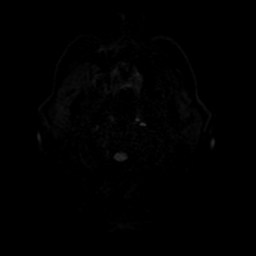
[im 13/99]
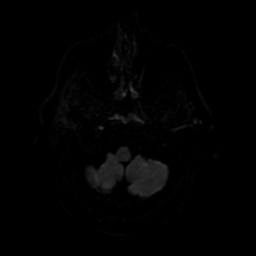
[im 25/99]
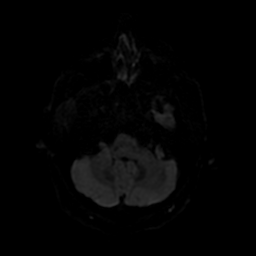
[im 37/99]
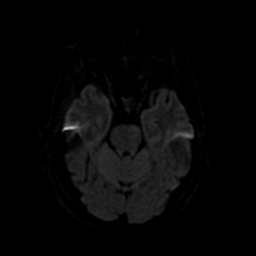
[im 50/99]
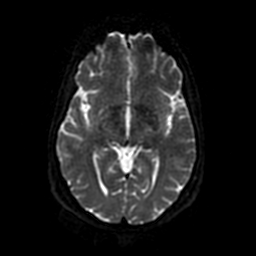
[im 62/99]
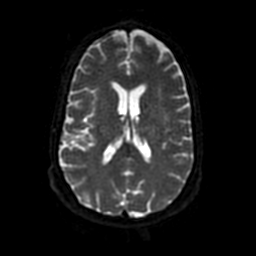
[im 74/99]
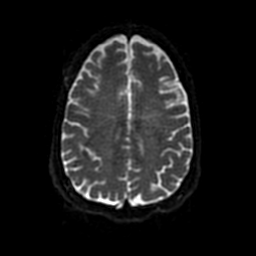
[im 86/99]
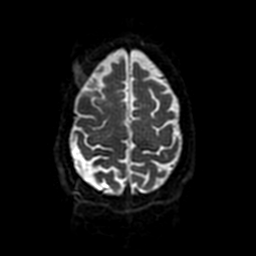
[im 99/99]
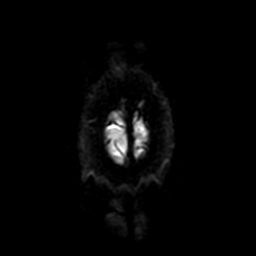

[Series 5: DWI · coronal · 4.0mm · 0.94mm/px · 7 of 72 slices shown (2 of 2)]
[im 1/72]
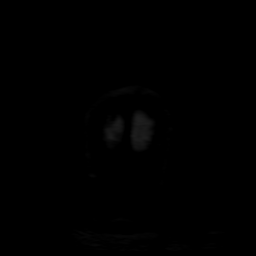
[im 12/72]
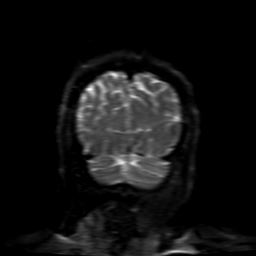
[im 24/72]
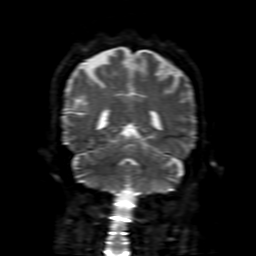
[im 36/72]
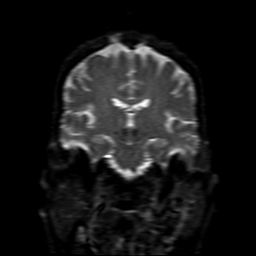
[im 48/72]
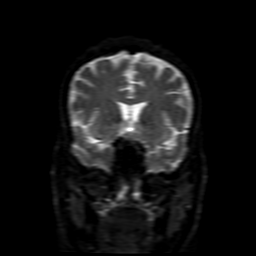
[im 60/72]
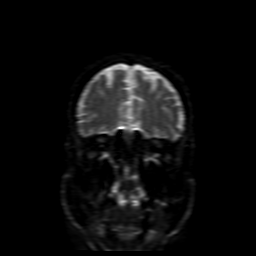
[im 72/72]
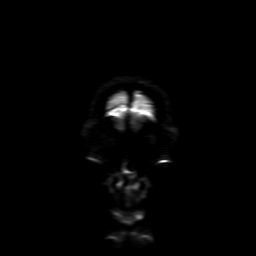

[Series 6: T2 · axial · 5.0mm · 0.47mm/px · z∈[-55,+89]mm · 2 of 25 slices shown (1 of 2)]
[im 1/25]
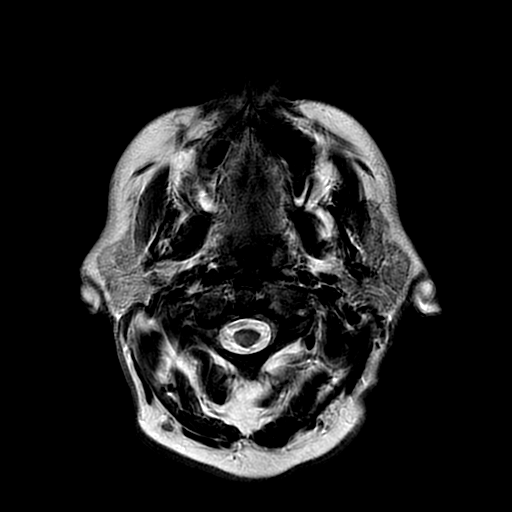
[im 25/25]
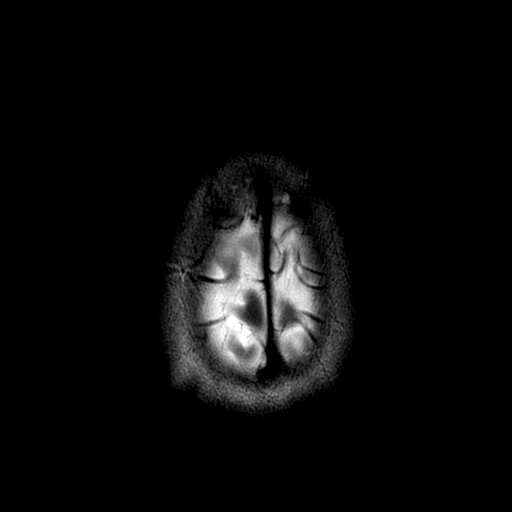

[Series 7: FLAIR · axial · 5.0mm · 0.47mm/px · z∈[-55,+89]mm · 2 of 25 slices shown (2 of 2)]
[im 1/25]
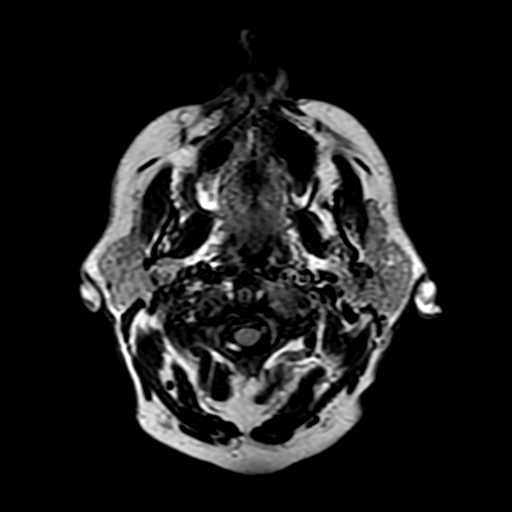
[im 25/25]
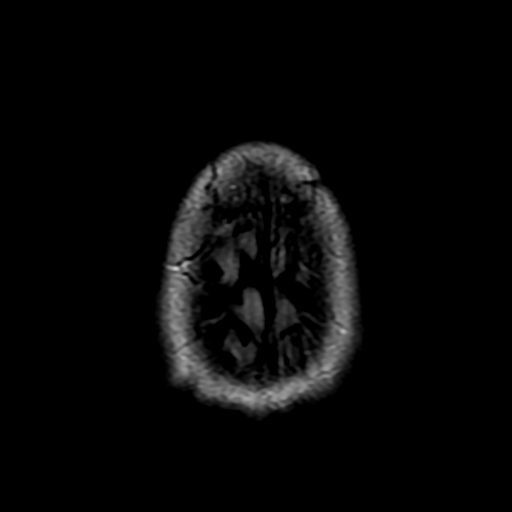

[Series 8: (person_name) · axial · 3.0mm · 0.47mm/px · z∈[-57,-40]mm · 2 of 100 slices shown]
[im 1/100]
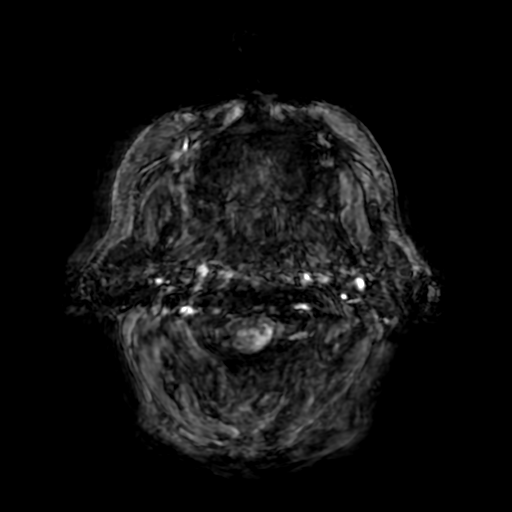
[im 12/100]
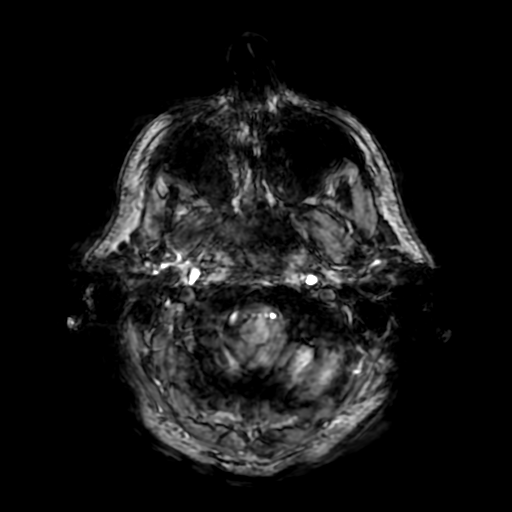

[Series 10: T2 · coronal · 5.0mm · 0.47mm/px · 3 of 30 slices shown (2 of 2)]
[im 1/30]
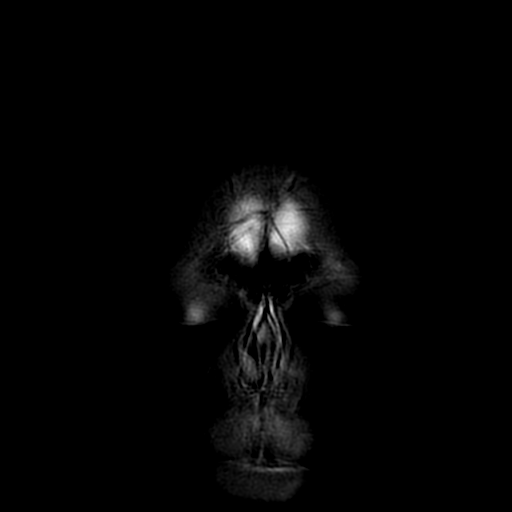
[im 15/30]
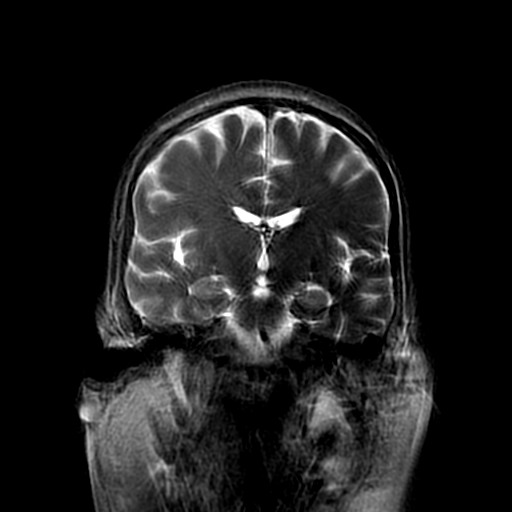
[im 30/30]
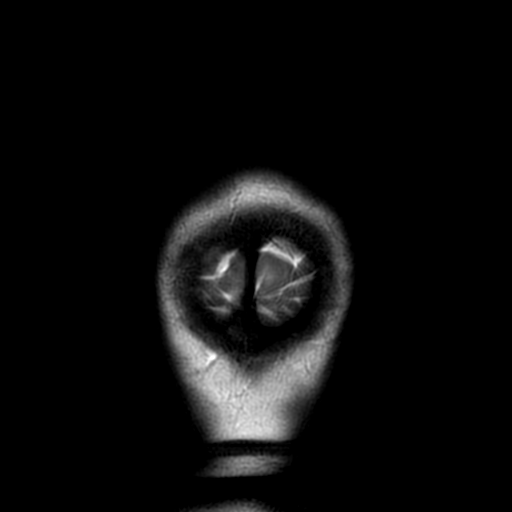

[Series 450: ADC · axial · 3.0mm · 0.94mm/px · z∈[-53,+94]mm · 4 of 47 slices shown (1 of 2)]
[im 1/47]
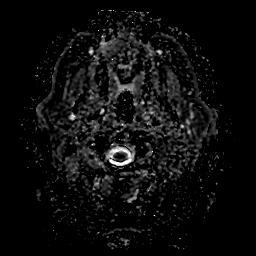
[im 16/47]
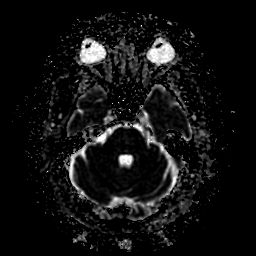
[im 31/47]
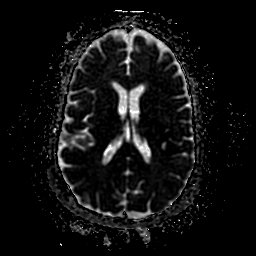
[im 47/47]
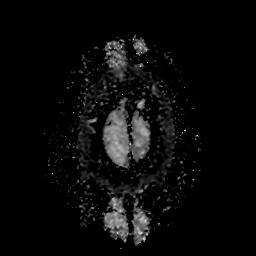

[Series 551: ADC · coronal · 4.0mm · 0.94mm/px · 3 of 36 slices shown (2 of 2)]
[im 1/36]
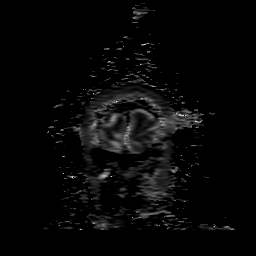
[im 18/36]
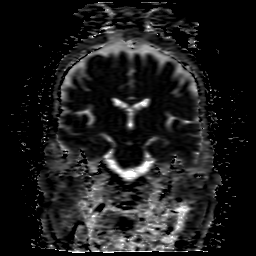
[im 36/36]
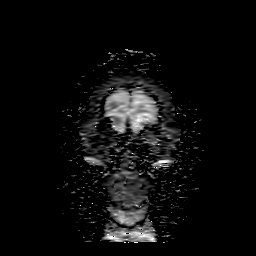

[35 of 48 positions shown; findings below may reference images not displayed]

FINDINGS: Ventricle size is normal.  Cerebral volume is normal.

Negative for acute or chronic infarction.

Negative for demyelinating disease. Cerebral white matter normal.
Basal ganglia and brainstem normal.

Negative for hemorrhage or fluid collection.

Negative for mass or edema.  No shift of the midline structures

Paranasal sinuses clear.

Normal orbital structures.  Pituitary not enlarged.
IMPRESSION: Normal

## 2018-06-10 DIAGNOSIS — I959 Hypotension, unspecified: Secondary | ICD-10-CM

## 2018-06-10 DIAGNOSIS — E860 Dehydration: Secondary | ICD-10-CM

## 2018-06-10 DIAGNOSIS — R079 Chest pain, unspecified: Secondary | ICD-10-CM

## 2018-06-11 DIAGNOSIS — R079 Chest pain, unspecified: Secondary | ICD-10-CM

## 2018-08-15 ENCOUNTER — Ambulatory Visit
Admission: RE | Admit: 2018-08-15 | Discharge: 2018-08-15 | Disposition: A | Discharge status: Home / Self Care | Payer: Medicare HMO | Source: Ambulatory Visit | Attending: Orthopaedic Surgery | Admitting: Orthopaedic Surgery

## 2018-08-15 ENCOUNTER — Other Ambulatory Visit: Payer: Self-pay

## 2018-08-15 ENCOUNTER — Other Ambulatory Visit: Payer: Self-pay | Admitting: Orthopaedic Surgery

## 2018-08-15 DIAGNOSIS — Z77018 Contact with and (suspected) exposure to other hazardous metals: Secondary | ICD-10-CM
